# Patient Record
Sex: Female | Born: 1975 | Race: Black or African American | Hispanic: No | Marital: Single | Smoking: Current some day smoker
Health system: Southern US, Community
[De-identification: ages and names within clinical notes are randomized; demographics above are authoritative.]

## PROBLEM LIST (undated history)

## (undated) ENCOUNTER — Emergency Department (HOSPITAL_COMMUNITY): Payer: Medicaid Other

## (undated) DIAGNOSIS — E079 Disorder of thyroid, unspecified: Secondary | ICD-10-CM

## (undated) DIAGNOSIS — K439 Ventral hernia without obstruction or gangrene: Secondary | ICD-10-CM

---

## 2014-11-23 HISTORY — PX: THYROIDECTOMY: SHX17

## 2015-05-03 ENCOUNTER — Encounter (HOSPITAL_COMMUNITY): Payer: Self-pay

## 2015-05-03 ENCOUNTER — Emergency Department (HOSPITAL_COMMUNITY)
Admission: EM | Admit: 2015-05-03 | Discharge: 2015-05-03 | Disposition: A | Discharge status: Home / Self Care | Payer: Medicaid Other | Attending: Emergency Medicine | Admitting: Emergency Medicine

## 2015-05-03 DIAGNOSIS — N760 Acute vaginitis: Secondary | ICD-10-CM | POA: Insufficient documentation

## 2015-05-03 DIAGNOSIS — Z3202 Encounter for pregnancy test, result negative: Secondary | ICD-10-CM | POA: Insufficient documentation

## 2015-05-03 DIAGNOSIS — B9689 Other specified bacterial agents as the cause of diseases classified elsewhere: Secondary | ICD-10-CM

## 2015-05-03 DIAGNOSIS — Z79899 Other long term (current) drug therapy: Secondary | ICD-10-CM | POA: Insufficient documentation

## 2015-05-03 DIAGNOSIS — N898 Other specified noninflammatory disorders of vagina: Secondary | ICD-10-CM | POA: Diagnosis present

## 2015-05-03 DIAGNOSIS — L259 Unspecified contact dermatitis, unspecified cause: Secondary | ICD-10-CM | POA: Insufficient documentation

## 2015-05-03 DIAGNOSIS — E079 Disorder of thyroid, unspecified: Secondary | ICD-10-CM | POA: Diagnosis not present

## 2015-05-03 HISTORY — DX: Disorder of thyroid, unspecified: E07.9

## 2015-05-03 LAB — URINALYSIS, ROUTINE W REFLEX MICROSCOPIC
BILIRUBIN URINE: NEGATIVE
Glucose, UA: NEGATIVE mg/dL
Ketones, ur: NEGATIVE mg/dL
LEUKOCYTES UA: NEGATIVE
Nitrite: NEGATIVE
Protein, ur: NEGATIVE mg/dL
Specific Gravity, Urine: 1.016 (ref 1.005–1.030)
Urobilinogen, UA: 0.2 mg/dL (ref 0.0–1.0)
pH: 6 (ref 5.0–8.0)

## 2015-05-03 LAB — URINE MICROSCOPIC-ADD ON

## 2015-05-03 LAB — WET PREP, GENITAL
Trich, Wet Prep: NONE SEEN
Yeast Wet Prep HPF POC: NONE SEEN

## 2015-05-03 LAB — POC URINE PREG, ED: PREG TEST UR: NEGATIVE

## 2015-05-03 MED ORDER — METRONIDAZOLE 500 MG PO TABS
500.0000 mg | ORAL_TABLET | Freq: Two times a day (BID) | ORAL | Status: DC
Start: 2015-05-03 — End: 2016-11-24

## 2015-05-03 MED ORDER — PREDNISONE 20 MG PO TABS
40.0000 mg | ORAL_TABLET | Freq: Every day | ORAL | Status: DC
Start: 2015-05-03 — End: 2017-05-21

## 2015-05-03 MED ORDER — DEXAMETHASONE SODIUM PHOSPHATE 10 MG/ML IJ SOLN
10.0000 mg | Freq: Once | INTRAMUSCULAR | Status: AC
  Administered 2015-05-03: 10 mg via INTRAMUSCULAR
  Filled 2015-05-03: qty 1

## 2015-05-03 NOTE — Discharge Instructions (Signed)
Bacterial Vaginosis Bacterial vaginosis is a vaginal infection that occurs when the normal balance of bacteria in the vagina is disrupted. It results from an overgrowth of certain bacteria. This is the most common vaginal infection in women of childbearing age. Treatment is important to prevent complications, especially in pregnant women, as it can cause a premature delivery. CAUSES  Bacterial vaginosis is caused by an increase in harmful bacteria that are normally present in smaller amounts in the vagina. Several different kinds of bacteria can cause bacterial vaginosis. However, the reason that the condition develops is not fully understood. RISK FACTORS Certain activities or behaviors can put you at an increased risk of developing bacterial vaginosis, including:  Having a new sex partner or multiple sex partners.  Douching.  Using an intrauterine device (IUD) for contraception. Women do not get bacterial vaginosis from toilet seats, bedding, swimming pools, or contact with objects around them. SIGNS AND SYMPTOMS  Some women with bacterial vaginosis have no signs or symptoms. Common symptoms include:  Grey vaginal discharge.  A fishlike odor with discharge, especially after sexual intercourse.  Itching or burning of the vagina and vulva.  Burning or pain with urination. DIAGNOSIS  Your health care provider will take a medical history and examine the vagina for signs of bacterial vaginosis. A sample of vaginal fluid may be taken. Your health care provider will look at this sample under a microscope to check for bacteria and abnormal cells. A vaginal pH test may also be done.  TREATMENT  Bacterial vaginosis may be treated with antibiotic medicines. These may be given in the form of a pill or a vaginal cream. A second round of antibiotics may be prescribed if the condition comes back after treatment.  HOME CARE INSTRUCTIONS   Only take over-the-counter or prescription medicines as  directed by your health care provider.  If antibiotic medicine was prescribed, take it as directed. Make sure you finish it even if you start to feel better.  Do not have sex until treatment is completed.  Tell all sexual partners that you have a vaginal infection. They should see their health care provider and be treated if they have problems, such as a mild rash or itching.  Practice safe sex by using condoms and only having one sex partner. SEEK MEDICAL CARE IF:   Your symptoms are not improving after 3 days of treatment.  You have increased discharge or pain.  You have a fever. MAKE SURE YOU:   Understand these instructions.  Will watch your condition.  Will get help right away if you are not doing well or get worse. FOR MORE INFORMATION  Centers for Disease Control and Prevention, Division of STD Prevention: SolutionApps.co.za American Sexual Health Association (ASHA): www.ashastd.org  Document Released: 09/10/2005 Document Revised: 07/01/2013 Document Reviewed: 04/22/2013 [facility] Patient Information 2015 [place], [place]. This information is not intended to replace advice given to you by your health care provider. Make sure you discuss any questions you have with your health care provider.  Contact Dermatitis Contact dermatitis is a reaction to certain substances that touch the skin. Contact dermatitis can be either irritant contact dermatitis or allergic contact dermatitis. Irritant contact dermatitis does not require previous exposure to the substance for a reaction to occur.Allergic contact dermatitis only occurs if you have been exposed to the substance before. Upon a repeat exposure, your body reacts to the substance.  CAUSES  Many substances can cause contact dermatitis. Irritant dermatitis is most commonly caused by repeated exposure to  mildly irritating substances, such as:  Makeup.  Soaps.  Detergents.  Bleaches.  Acids.  Metal salts, such as nickel. Allergic  contact dermatitis is most commonly caused by exposure to:  Poisonous plants.  Chemicals (deodorants, shampoos).  Jewelry.  Latex.  Neomycin in triple antibiotic cream.  Preservatives in products, including clothing. SYMPTOMS  The area of skin that is exposed may develop:  Dryness or flaking.  Redness.  Cracks.  Itching.  Pain or a burning sensation.  Blisters. With allergic contact dermatitis, there may also be swelling in areas such as the eyelids, mouth, or genitals.  DIAGNOSIS  Your caregiver can usually tell what the problem is by doing a physical exam. In cases where the cause is uncertain and an allergic contact dermatitis is suspected, a patch skin test may be performed to help determine the cause of your dermatitis. TREATMENT Treatment includes protecting the skin from further contact with the irritating substance by avoiding that substance if possible. Barrier creams, powders, and gloves may be helpful. Your caregiver may also recommend:  Steroid creams or ointments applied 2 times daily. For best results, soak the rash area in cool water for 20 minutes. Then apply the medicine. Cover the area with a plastic wrap. You can store the steroid cream in the refrigerator for a "chilly" effect on your rash. That may decrease itching. Oral steroid medicines may be needed in more severe cases.  Antibiotics or antibacterial ointments if a skin infection is present.  Antihistamine lotion or an antihistamine taken by mouth to ease itching.  Lubricants to keep moisture in your skin.  Burow's solution to reduce redness and soreness or to dry a weeping rash. Mix one packet or tablet of solution in 2 cups cool water. Dip a clean washcloth in the mixture, wring it out a bit, and put it on the affected area. Leave the cloth in place for 30 minutes. Do this as often as possible throughout the day.  Taking several cornstarch or baking soda baths daily if the area is too large to  cover with a washcloth. Harsh chemicals, such as alkalis or acids, can cause skin damage that is like a burn. You should flush your skin for 15 to 20 minutes with cold water after such an exposure. You should also seek immediate medical care after exposure. Bandages (dressings), antibiotics, and pain medicine may be needed for severely irritated skin.  HOME CARE INSTRUCTIONS  Avoid the substance that caused your reaction.  Keep the area of skin that is affected away from hot water, soap, sunlight, chemicals, acidic substances, or anything else that would irritate your skin.  Do not scratch the rash. Scratching may cause the rash to become infected.  You may take cool baths to help stop the itching.  Only take over-the-counter or prescription medicines as directed by your caregiver.  See your caregiver for follow-up care as directed to make sure your skin is healing properly. SEEK MEDICAL CARE IF:   Your condition is not better after 3 days of treatment.  You seem to be getting worse.  You see signs of infection such as swelling, tenderness, redness, soreness, or warmth in the affected area.  You have any problems related to your medicines. Document Released: 09/07/2000 Document Revised: 12/03/2011 Document Reviewed: 02/13/2011 [facility] Patient Information 2015 [place], [place]. This information is not intended to replace advice given to you by your health care provider. Make sure you discuss any questions you have with your health care provider.

## 2015-05-03 NOTE — ED Notes (Signed)
No apparent distress at this time

## 2015-05-03 NOTE — ED Provider Notes (Signed)
CSN: [id number]     Arrival date & time 05/03/15  1028 History   First MD Initiated Contact with Patient 05/03/15 1127     Chief Complaint  Patient presents with  . Facial Swelling  . Vaginal Discharge     (Consider location/radiation/quality/duration/timing/severity/associated sxs/prior Treatment) HPI This is a 39 year old female who presents to the emergency department with chief complaint of facial swelling and vaginal discharge. The patient states that yesterday she used a moisturizing facemask immediately began having itching and swelling. This morning woke up with lip swelling. She denies any oral swelling, change in voice, wheezing, or sublingual swelling. Difficulty swallowing. Patient also has noted that clear vaginal discharge with some mild foul odor. She denies dyspareunia, urinary symptoms. She is sexually active with a single female partner.  Past Medical History  Diagnosis Date  . Thyroid disease    Past Surgical History  Procedure Laterality Date  . Thyroidectomy  March 2016   History reviewed. No pertinent family history. History  Substance Use Topics  . Smoking status: Never Smoker   . Smokeless tobacco: Not on file  . Alcohol Use: 2.4 oz/week    4 Glasses of wine per week   OB History    No data available     Review of Systems  Ten systems reviewed and are negative for acute change, except as noted in the HPI.    Allergies  Review of patient's allergies indicates no known allergies.  Home Medications   Prior to Admission medications   Medication Sig Start Date End Date Taking? Authorizing Provider  levothyroxine (SYNTHROID, LEVOTHROID) 100 MCG tablet Take 100 mcg by mouth daily before breakfast.   Yes Historical Provider, MD   BP 121/72 mmHg  Pulse 58  Temp(Src) 98.5 F (36.9 C) (Oral)  Resp 20  SpO2 98%  LMP 04/21/2015 Physical Exam  Constitutional: She is oriented to person, place, and time. She appears well-developed and well-nourished. No  distress.  HENT:  Head: Normocephalic and atraumatic.  Mild swelling and fine papular rash over the face with lip swelling.  Pharynx is patent, no swelling. Normal phonation.  Eyes: Conjunctivae are normal. No scleral icterus.  Neck: Normal range of motion.  Cardiovascular: Normal rate, regular rhythm and normal heart sounds.  Exam reveals no gallop and no friction rub.   No murmur heard. Pulmonary/Chest: Effort normal. No respiratory distress. She has no wheezes. She exhibits no tenderness.  Abdominal: Soft. Bowel sounds are normal. She exhibits no distension and no mass. There is no tenderness. There is no guarding.  Genitourinary:  Pelvic exam: normal external genitalia, vulva, vagina, cervix, uterus and adnexa.   Neurological: She is alert and oriented to person, place, and time.  Skin: Skin is warm and dry. She is not diaphoretic.  Nursing note and vitals reviewed.   ED Course  Procedures (including critical care time) Labs Review Labs Reviewed  URINALYSIS, ROUTINE W REFLEX MICROSCOPIC (NOT AT [facility]) - Abnormal; Notable for the following:    Hgb urine dipstick SMALL (*)    All other components within normal limits  WET PREP, GENITAL  URINE MICROSCOPIC-ADD ON  HIV ANTIBODY (ROUTINE TESTING)  RPR  POC URINE PREG, ED  GC/CHLAMYDIA PROBE AMP ([place]) NOT AT [facility]    Imaging Review No results found.   EKG Interpretation None      MDM   Final diagnoses:  BV (bacterial vaginosis)  Contact dermatitis     Patient with benign pelvic examination, positive for clue cells, no  cervical motion tenderness, no adnexal tenderness or fullness. Patient will be treated with Flagyl at discharge. Patient given IM Decadron for contact dermatitis of the face and lips. No signs of airway compromise or systemic allergic reaction. The patient will be discharged with 5 days of oral prednisone. She appears safe for discharge at this time. Her GC chlamydia tests are pending.    Arthor Captain, PA-C 05/03/15 1550  Tilden Fossa, MD 05/04/15 [phone]

## 2015-05-03 NOTE — ED Notes (Signed)
Pt reports onset yesterday morning she applied a moisturizing spa treatment mast with shea butter and coconut oil on face.  Within 6 hours face started swelling, itching and few bumps on face. Lips started swelling this morning.  No throat swelling or difficulty swallowing.  Pt also c/o onset 1 week clear vaginal discharge with slight odor.  No abd pain, vaginal itching or urinary complaints.

## 2015-05-04 LAB — GC/CHLAMYDIA PROBE AMP (~~LOC~~) NOT AT ARMC
Chlamydia: NEGATIVE
NEISSERIA GONORRHEA: NEGATIVE

## 2015-05-04 LAB — HIV ANTIBODY (ROUTINE TESTING W REFLEX): HIV SCREEN 4TH GENERATION: NONREACTIVE

## 2015-05-04 LAB — RPR: RPR Ser Ql: NONREACTIVE

## 2016-06-01 ENCOUNTER — Encounter (HOSPITAL_COMMUNITY): Payer: Self-pay | Admitting: *Deleted

## 2016-06-01 ENCOUNTER — Emergency Department (HOSPITAL_COMMUNITY): Payer: Self-pay

## 2016-06-01 ENCOUNTER — Emergency Department (HOSPITAL_COMMUNITY)
Admission: EM | Admit: 2016-06-01 | Discharge: 2016-06-02 | Disposition: A | Discharge status: Home / Self Care | Payer: Self-pay | Attending: Physician Assistant | Admitting: Physician Assistant

## 2016-06-01 DIAGNOSIS — E039 Hypothyroidism, unspecified: Secondary | ICD-10-CM

## 2016-06-01 DIAGNOSIS — R109 Unspecified abdominal pain: Secondary | ICD-10-CM | POA: Insufficient documentation

## 2016-06-01 LAB — CBC
HCT: 38.7 % (ref 36.0–46.0)
Hemoglobin: 12.8 g/dL (ref 12.0–15.0)
MCH: 30 pg (ref 26.0–34.0)
MCHC: 33.1 g/dL (ref 30.0–36.0)
MCV: 90.8 fL (ref 78.0–100.0)
Platelets: 300 10*3/uL (ref 150–400)
RBC: 4.26 MIL/uL (ref 3.87–5.11)
RDW: 16.4 % — ABNORMAL HIGH (ref 11.5–15.5)
WBC: 7.6 10*3/uL (ref 4.0–10.5)

## 2016-06-01 LAB — COMPREHENSIVE METABOLIC PANEL
ALBUMIN: 4.6 g/dL (ref 3.5–5.0)
ALK PHOS: 39 U/L (ref 38–126)
ALT: 40 U/L (ref 14–54)
AST: 45 U/L — ABNORMAL HIGH (ref 15–41)
Anion gap: 6 (ref 5–15)
BUN: 6 mg/dL (ref 6–20)
CO2: 28 mmol/L (ref 22–32)
Calcium: 9.9 mg/dL (ref 8.9–10.3)
Chloride: 103 mmol/L (ref 101–111)
Creatinine, Ser: 1.2 mg/dL — ABNORMAL HIGH (ref 0.44–1.00)
GFR calc Af Amer: >60 mL/min (ref >60)
GFR calc non Af Amer: 56 mL/min — ABNORMAL LOW (ref >60)
GLUCOSE: 87 mg/dL (ref 65–99)
POTASSIUM: 4 mmol/L (ref 3.5–5.1)
SODIUM: 137 mmol/L (ref 135–145)
Total Bilirubin: 0.9 mg/dL (ref 0.3–1.2)
Total Protein: 7.2 g/dL (ref 6.5–8.1)

## 2016-06-01 LAB — URINE MICROSCOPIC-ADD ON

## 2016-06-01 LAB — URINALYSIS, ROUTINE W REFLEX MICROSCOPIC
Bilirubin Urine: NEGATIVE
Glucose, UA: NEGATIVE mg/dL
Ketones, ur: NEGATIVE mg/dL
LEUKOCYTES UA: NEGATIVE
Nitrite: NEGATIVE
PROTEIN: NEGATIVE mg/dL
Specific Gravity, Urine: 1.017 (ref 1.005–1.030)
pH: 7.5 (ref 5.0–8.0)

## 2016-06-01 LAB — LIPASE, BLOOD: Lipase: 130 U/L — ABNORMAL HIGH (ref 11–51)

## 2016-06-01 LAB — POC URINE PREG, ED: PREG TEST UR: NEGATIVE

## 2016-06-01 MED ORDER — LEVOTHYROXINE SODIUM 100 MCG PO TABS: 100.0000 ug | ORAL_TABLET | Freq: Every day | ORAL | 0 refills | Status: DC

## 2016-06-01 MED ORDER — SODIUM CHLORIDE 0.9 % IV BOLUS (SEPSIS)
1000.0000 mL | Freq: Once | INTRAVENOUS | Status: AC
  Administered 2016-06-01: 1000 mL via INTRAVENOUS

## 2016-06-01 MED ORDER — SODIUM CHLORIDE 0.9 % IV BOLUS (SEPSIS): 1000.0000 mL | Freq: Once | INTRAVENOUS | Status: DC

## 2016-06-01 NOTE — ED Notes (Signed)
EDP at bedside  

## 2016-06-01 NOTE — Care Management (Signed)
CM met with patient to discuss f/u care. Patient is uninsured without a PCP. Discussed the [facility] patient is agreeable with establishing care at the [facility]. Appt schedule for 9/19 at 4p

## 2016-06-01 NOTE — ED Triage Notes (Signed)
The pt is c/o bi-lateral flank pain for 2 weeks.  No urinary symptoms  Just retaining fluid.  She is supposed to take thyroid med but has not taken any for 3 months  lmp 2 days ago

## 2016-06-01 NOTE — ED Provider Notes (Signed)
[facility]-EMERGENCY DEPT Provider Note   CSN: [id number] Arrival date & time: 06/01/16  1608     History   Chief Complaint Chief Complaint  Patient presents with  . Back Pain    HPI Judene CompanionDarshana Jafri is a 40 y.o. female.  The history is provided by the patient.  Back Pain   This is a recurrent problem. The current episode started more than 1 week ago. Episode frequency: inconsistently. The problem has not changed since onset.The pain is associated with no known injury. The pain is present in the lumbar spine. The quality of the pain is described as aching. The pain does not radiate. The pain is at a severity of 3/10. The pain is mild. The pain is the same all the time. Pertinent negatives include no chest pain, no fever, no abdominal pain, no abdominal swelling, no bladder incontinence, no dysuria, no pelvic pain, no paresthesias, no tingling and no weakness. She has tried nothing for the symptoms. The treatment provided no relief.    Past Medical History:  Diagnosis Date  . Thyroid disease     There are no active problems to display for this patient.   Past Surgical History:  Procedure Laterality Date  . THYROIDECTOMY  March 2016    OB History    No data available       Home Medications    Prior to Admission medications   Medication Sig Start Date End Date Taking? Authorizing Provider  levothyroxine (SYNTHROID, LEVOTHROID) 100 MCG tablet Take 100 mcg by mouth daily before breakfast.    Historical Provider, MD  metroNIDAZOLE (FLAGYL) 500 MG tablet Take 1 tablet (500 mg total) by mouth 2 (two) times daily. One po bid x 7 days 05/03/15   Arthor CaptainAbigail Harris, PA-C  predniSONE (DELTASONE) 20 MG tablet Take 2 tablets (40 mg total) by mouth daily. 05/03/15   Arthor CaptainAbigail Harris, PA-C    Family History No family history on file.  Social History Social History  Substance Use Topics  . Smoking status: Never Smoker  . Smokeless tobacco: Never Used  . Alcohol use 2.4 oz/week    4 Glasses  of wine per week     Allergies   Review of patient's allergies indicates no known allergies.   Review of Systems Review of Systems  Constitutional: Negative for fatigue and fever.  Respiratory: Negative for chest tightness.   Cardiovascular: Negative for chest pain.  Gastrointestinal: Negative for abdominal distention, abdominal pain, diarrhea, nausea and vomiting.  Genitourinary: Positive for flank pain. Negative for bladder incontinence, difficulty urinating, dyspareunia, dysuria, pelvic pain and urgency.  Musculoskeletal: Positive for back pain.  Neurological: Negative for tingling, weakness and paresthesias.  All other systems reviewed and are negative.    Physical Exam Updated Vital Signs BP 117/84   Pulse 67   Temp 98.3 F (36.8 C) (Oral)   Resp 16   Ht 5\' 1"  (1.549 m)   Wt 178 lb 9 oz (81 kg)   LMP 05/30/2016   SpO2 100%   BMI 33.74 kg/m   Physical Exam  Constitutional: She is oriented to person, place, and time. She appears well-developed and well-nourished.  HENT:  Head: Normocephalic and atraumatic.  Eyes: Right eye exhibits no discharge.  Cardiovascular: Normal rate and regular rhythm.   Pulmonary/Chest: Effort normal.  Abdominal: Soft. She exhibits no distension. There is no tenderness. There is no guarding.  Musculoskeletal:  No cva tenderness  Neurological: She is oriented to person, place, and time.  Skin: Skin is  warm and dry. She is not diaphoretic.  Psychiatric: She has a normal mood and affect.  Nursing note and vitals reviewed.    ED Treatments / Results  Labs (all labs ordered are listed, but only abnormal results are displayed) Labs Reviewed  LIPASE, BLOOD - Abnormal; Notable for the following:       Result Value   Lipase 130 (*)    All other components within normal limits  COMPREHENSIVE METABOLIC PANEL - Abnormal; Notable for the following:    Creatinine, Ser 1.20 (*)    AST 45 (*)    GFR calc non Af Amer 56 (*)    All other  components within normal limits  CBC - Abnormal; Notable for the following:    RDW 16.4 (*)    All other components within normal limits  URINALYSIS, ROUTINE W REFLEX MICROSCOPIC (NOT AT [facility]) - Abnormal; Notable for the following:    Hgb urine dipstick MODERATE (*)    All other components within normal limits  URINE MICROSCOPIC-ADD ON - Abnormal; Notable for the following:    Squamous Epithelial / LPF 0-5 (*)    Bacteria, UA RARE (*)    All other components within normal limits  POC URINE PREG, ED    EKG  EKG Interpretation None       Radiology No results found.  Procedures Procedures (including critical care time)  Medications Ordered in ED Medications - No data to display   Initial Impression / Assessment and Plan / ED Course  I have reviewed the triage vital signs and the nursing notes.  Pertinent labs & imaging results that were available during my care of the patient were reviewed by me and considered in my medical decision making (see chart for details).  Clinical Course   Patient is a very pleasant 40 year old female presenting with several symptoms. Her main complaint today is that she moved recently from [place] but unable to get her levothyroxine paid for by Medicaid. She's had trouble transitioning her Medicaid here. She also reports bilateral back pain which she is worried that her kidneys are hurting. Will do CT stone study, though her pain is mild.  \ Consulted case management- they set her up with outpatient follow up and we will provide one months script for her levothyroxine.   Patient has elevated lipase, asymptomatic - no n/v or RUQ pain. Normal LFTs. Will have her PCP (next week) repeat, consider outpatient triglycerides etc.   Patient has normal vitals and PE.   Patient is comfortable, ambulatory, and taking PO at time of discharge.  Patient expressed understanding about return precautions.    Final Clinical Impressions(s) / ED Diagnoses    Final diagnoses:  Flank pain    New Prescriptions New Prescriptions   No medications on file     Chesney Suares Randall An, MD 06/01/16 2311

## 2016-06-01 NOTE — Discharge Instructions (Signed)
Your labs showed a mildly increased creatinine which could be from mild dehydration. Please have this lab repeated this week when you see your primary care physician. In addition you had a mildly increased lipase. Please follow-up with this with her primary care physician or your GI physician. We provided you with a month long prescription for your thyroid medication.

## 2016-06-12 ENCOUNTER — Inpatient Hospital Stay: Payer: Medicaid Other | Admitting: Family Medicine

## 2016-11-24 ENCOUNTER — Encounter (HOSPITAL_COMMUNITY): Payer: Self-pay | Admitting: Emergency Medicine

## 2016-11-24 ENCOUNTER — Emergency Department (HOSPITAL_COMMUNITY)
Admission: EM | Admit: 2016-11-24 | Discharge: 2016-11-24 | Disposition: A | Discharge status: Home / Self Care | Payer: Medicaid Other | Attending: Emergency Medicine | Admitting: Emergency Medicine

## 2016-11-24 DIAGNOSIS — Z202 Contact with and (suspected) exposure to infections with a predominantly sexual mode of transmission: Secondary | ICD-10-CM | POA: Insufficient documentation

## 2016-11-24 DIAGNOSIS — N898 Other specified noninflammatory disorders of vagina: Secondary | ICD-10-CM

## 2016-11-24 DIAGNOSIS — Z711 Person with feared health complaint in whom no diagnosis is made: Secondary | ICD-10-CM

## 2016-11-24 DIAGNOSIS — N760 Acute vaginitis: Secondary | ICD-10-CM | POA: Insufficient documentation

## 2016-11-24 DIAGNOSIS — Z79899 Other long term (current) drug therapy: Secondary | ICD-10-CM | POA: Insufficient documentation

## 2016-11-24 DIAGNOSIS — B9689 Other specified bacterial agents as the cause of diseases classified elsewhere: Secondary | ICD-10-CM

## 2016-11-24 DIAGNOSIS — R300 Dysuria: Secondary | ICD-10-CM

## 2016-11-24 LAB — COMPREHENSIVE METABOLIC PANEL
ALBUMIN: 4.1 g/dL (ref 3.5–5.0)
ALK PHOS: 36 U/L — AB (ref 38–126)
ALT: 15 U/L (ref 14–54)
ANION GAP: 6 (ref 5–15)
AST: 22 U/L (ref 15–41)
BUN: 5 mg/dL — AB (ref 6–20)
CO2: 27 mmol/L (ref 22–32)
Calcium: 9.2 mg/dL (ref 8.9–10.3)
Chloride: 101 mmol/L (ref 101–111)
Creatinine, Ser: 0.84 mg/dL (ref 0.44–1.00)
GFR calc Af Amer: >60 mL/min (ref >60)
GFR calc non Af Amer: >60 mL/min (ref >60)
GLUCOSE: 82 mg/dL (ref 65–99)
Potassium: 4.1 mmol/L (ref 3.5–5.1)
SODIUM: 134 mmol/L — AB (ref 135–145)
Total Bilirubin: 1.1 mg/dL (ref 0.3–1.2)
Total Protein: 6.8 g/dL (ref 6.5–8.1)

## 2016-11-24 LAB — CBC WITH DIFFERENTIAL/PLATELET
BASOS ABS: 0.1 10*3/uL (ref 0.0–0.1)
BASOS PCT: 1 %
EOS ABS: 0.3 10*3/uL (ref 0.0–0.7)
Eosinophils Relative: 6 %
HEMATOCRIT: 39 % (ref 36.0–46.0)
HEMOGLOBIN: 13.1 g/dL (ref 12.0–15.0)
Lymphocytes Relative: 46 %
Lymphs Abs: 2.3 10*3/uL (ref 0.7–4.0)
MCH: 29.8 pg (ref 26.0–34.0)
MCHC: 33.6 g/dL (ref 30.0–36.0)
MCV: 88.6 fL (ref 78.0–100.0)
MONOS PCT: 6 %
Monocytes Absolute: 0.3 10*3/uL (ref 0.1–1.0)
NEUTROS ABS: 2.1 10*3/uL (ref 1.7–7.7)
NEUTROS PCT: 41 %
Platelets: 308 10*3/uL (ref 150–400)
RBC: 4.4 MIL/uL (ref 3.87–5.11)
RDW: 15.4 % (ref 11.5–15.5)
WBC: 5 10*3/uL (ref 4.0–10.5)

## 2016-11-24 LAB — URINALYSIS, ROUTINE W REFLEX MICROSCOPIC
Bilirubin Urine: NEGATIVE
GLUCOSE, UA: NEGATIVE mg/dL
HGB URINE DIPSTICK: NEGATIVE
Ketones, ur: NEGATIVE mg/dL
LEUKOCYTES UA: NEGATIVE
NITRITE: NEGATIVE
PH: 5 (ref 5.0–8.0)
Protein, ur: NEGATIVE mg/dL
SPECIFIC GRAVITY, URINE: 1.02 (ref 1.005–1.030)

## 2016-11-24 LAB — WET PREP, GENITAL
SPERM: NONE SEEN
Trich, Wet Prep: NONE SEEN
Yeast Wet Prep HPF POC: NONE SEEN

## 2016-11-24 LAB — HCG, QUANTITATIVE, PREGNANCY: hCG, Beta Chain, Quant, S: <1 m[IU]/mL

## 2016-11-24 LAB — RAPID HIV SCREEN (HIV 1/2 AB+AG)
HIV 1/2 Antibodies: NONREACTIVE
HIV-1 P24 Antigen - HIV24: NONREACTIVE

## 2016-11-24 MED ORDER — METRONIDAZOLE 500 MG PO TABS: 500.0000 mg | ORAL_TABLET | Freq: Two times a day (BID) | ORAL | 0 refills | Status: DC

## 2016-11-24 MED ORDER — AZITHROMYCIN 250 MG PO TABS
1000.0000 mg | ORAL_TABLET | Freq: Once | ORAL | Status: AC
  Administered 2016-11-24: 1000 mg via ORAL
  Filled 2016-11-24: qty 4

## 2016-11-24 MED ORDER — LIDOCAINE HCL (PF) 1 % IJ SOLN
0.9000 mL | Freq: Once | INTRAMUSCULAR | Status: AC
  Administered 2016-11-24: 0.9 mL
  Filled 2016-11-24: qty 5

## 2016-11-24 MED ORDER — METRONIDAZOLE 500 MG PO TABS
2000.0000 mg | ORAL_TABLET | Freq: Once | ORAL | Status: AC
  Administered 2016-11-24: 2000 mg via ORAL
  Filled 2016-11-24: qty 4

## 2016-11-24 MED ORDER — CEFTRIAXONE SODIUM 250 MG IJ SOLR
250.0000 mg | Freq: Once | INTRAMUSCULAR | Status: AC
  Administered 2016-11-24: 250 mg via INTRAMUSCULAR
  Filled 2016-11-24: qty 250

## 2016-11-24 NOTE — ED Notes (Signed)
Pelvic cart setup at bedside by Tiffany, EDT.

## 2016-11-24 NOTE — Discharge Instructions (Signed)
Part of your lab testing has resulted. You have bacterial vaginosis. This is not a sexually transmitted disease, this can happen due to changes to the acidity of the vagina. Please take medication as prescribed and until finished. Please avoid consumption of alcohol as this will happen interaction with the medicine and call severe abdominal pain and vomiting.  Your gonorrhea, chlamydia, Trichomonas, HIV and syphilis testing is not yet back. We will contact you if the results are positive. Today you received treatment for gonorrhea, chlamydia and Trichomonas given that you have a recent exposure and some symptoms.

## 2016-11-24 NOTE — ED Triage Notes (Addendum)
Pt reports after having intercourse last night she began to experience pain in her pelvic area and burning with urination. Pt also reports white vaginal discharge with odor. Pt request to have full STD workup. After triage pt reports that she has not been taking her thyroid medication for 3 months.

## 2016-11-24 NOTE — ED Notes (Signed)
ED Provider at bedside. 

## 2016-11-24 NOTE — ED Provider Notes (Signed)
[facility] Provider Note   CSN: [id number] Arrival date & time: 11/24/16  7829     History   Chief Complaint Chief Complaint  Patient presents with  . Pelvic Pain    HPI Meghan Galvan is a 41 y.o. female presents to the emergency department reporting pelvic pain, dysuria, increased white vaginal discharge associated with odd odor that she noticed after sexual intercourse last night. Patient states that she urinated after sexual intercourse and noted the symptoms. Patient states that she is sexually active with one female partner who she has been for one year, she states that she stepped using condoms and any other barrier methods of contraception 6 months ago. LMP on 2/93/18. Patient is requesting full STD workup today including HIV. Patient denies fevers, nausea, vomiting, abdominal pain, vaginal sores or lesions. Patient denies previous sexually transmitted diseases. No appetite changes.  HPI  Past Medical History:  Diagnosis Date  . Thyroid disease     There are no active problems to display for this patient.   Past Surgical History:  Procedure Laterality Date  . THYROIDECTOMY  March 2016    OB History    No data available       Home Medications    Prior to Admission medications   Medication Sig Start Date End Date Taking? Authorizing Provider  levothyroxine (SYNTHROID, LEVOTHROID) 100 MCG tablet Take 1 tablet (100 mcg total) by mouth daily before breakfast. Patient not taking: Reported on 11/24/2016 06/01/16   [name], MD  metroNIDAZOLE (FLAGYL) 500 MG tablet Take 1 tablet (500 mg total) by mouth 2 (two) times daily. 11/24/16   Liberty Handy, PA-C  predniSONE (DELTASONE) 20 MG tablet Take 2 tablets (40 mg total) by mouth daily. Patient not taking: Reported on 06/01/2016 05/03/15   Arthor Captain, PA-C    Family History No family history on file.  Social History Social History  Substance Use Topics  . Smoking status: Never Smoker  .  Smokeless tobacco: Never Used  . Alcohol use 2.4 oz/week    4 Glasses of wine per week     Allergies   Patient has no known allergies.   Review of Systems Review of Systems  Constitutional: Negative for appetite change, chills and fever.  HENT: Negative for congestion.   Eyes: Negative for visual disturbance.  Respiratory: Negative for shortness of breath.   Cardiovascular: Negative for chest pain.  Gastrointestinal: Negative for abdominal pain, constipation, diarrhea, nausea and vomiting.  Genitourinary: Positive for difficulty urinating, dysuria, pelvic pain, vaginal discharge and vaginal pain. Negative for genital sores, hematuria and vaginal bleeding.  Musculoskeletal: Negative for arthralgias and back pain.  Skin: Negative for wound.  Neurological: Negative for dizziness, numbness and headaches.  Hematological: Does not bruise/bleed easily.  Psychiatric/Behavioral: Negative.      Physical Exam Updated Vital Signs BP 117/75   Pulse 61   Temp 98.5 F (36.9 C) (Oral)   Resp 16   Ht 5\' 1"  (1.549 m)   Wt 68 kg   LMP 11/15/2016   SpO2 100%   BMI 28.34 kg/m   Physical Exam  Constitutional: She is oriented to person, place, and time. She appears well-developed and well-nourished.  HENT:  Head: Normocephalic and atraumatic.  Nose: Nose normal.  Mouth/Throat: Oropharynx is clear and moist. No oropharyngeal exudate.  No oral lesions.  Eyes: Conjunctivae and EOM are normal. Pupils are equal, round, and reactive to light.  Neck: Normal range of motion. Neck supple. No JVD present.  Cardiovascular: Normal rate, regular rhythm and normal heart sounds.   No murmur heard. Pulmonary/Chest: Effort normal and breath sounds normal. No respiratory distress. She has no wheezes. She has no rales.  Abdominal: Soft. Bowel sounds are normal. She exhibits no distension and no mass. There is no tenderness. There is no rebound and no guarding. No hernia.  Genitourinary: Pelvic exam was  performed with patient prone. Vaginal discharge found.  Genitourinary Comments: +moderate white/gray vaginal discharge +whiff test  External genitalia normal without erythema, edema, tenderness, discharge or lesions.  No groin lymphadenopathy.  Vaginal mucosa and cervix normal, pink without lesions.  Uterus in midline, smooth, not enlarged or tender. No CMT. Non palpable adnexa.  Musculoskeletal: Normal range of motion. She exhibits no deformity.  Lymphadenopathy:    She has no cervical adenopathy.  Neurological: She is alert and oriented to person, place, and time. No sensory deficit.  Skin: Skin is warm and dry. Capillary refill takes less than 2 seconds.  Psychiatric: She has a normal mood and affect. Her behavior is normal. Judgment and thought content normal.  Nursing note and vitals reviewed.    ED Treatments / Results  Labs (all labs ordered are listed, but only abnormal results are displayed) Labs Reviewed  WET PREP, GENITAL - Abnormal; Notable for the following:       Result Value   Clue Cells Wet Prep HPF POC PRESENT (*)    WBC, Wet Prep HPF POC MANY (*)    All other components within normal limits  COMPREHENSIVE METABOLIC PANEL - Abnormal; Notable for the following:    Sodium 134 (*)    BUN 5 (*)    Alkaline Phosphatase 36 (*)    All other components within normal limits  HCG, QUANTITATIVE, PREGNANCY  URINALYSIS, ROUTINE W REFLEX MICROSCOPIC  CBC WITH DIFFERENTIAL/PLATELET  RAPID HIV SCREEN (HIV 1/2 AB+AG)  HIV ANTIBODY (ROUTINE TESTING)  RPR  GC/CHLAMYDIA PROBE AMP ([place]) NOT AT [facility]    EKG  EKG Interpretation None       Radiology No results found.  Procedures Procedures (including critical care time)  Medications Ordered in ED Medications  cefTRIAXone (ROCEPHIN) injection 250 mg (250 mg Intramuscular Given 11/24/16 1356)  metroNIDAZOLE (FLAGYL) tablet 2,000 mg (2,000 mg Oral Given 11/24/16 1356)  azithromycin (ZITHROMAX) tablet 1,000 mg (1,000  mg Oral Given 11/24/16 1356)  lidocaine (PF) (XYLOCAINE) 1 % injection 0.9 mL (0.9 mLs Other Given 11/24/16 1356)     Initial Impression / Assessment and Plan / ED Course  I have reviewed the triage vital signs and the nursing notes.  Pertinent labs & imaging results that were available during my care of the patient were reviewed by me and considered in my medical decision making (see chart for details).  Clinical Course as of Nov 24 1356  Sat Nov 24, 2016  1340 Nitrite: NEGATIVE [CG]  1341 Leukocytes, UA: NEGATIVE [CG]  1341 Appearance: CLEAR [CG]  1341 WBC: 5.0 [CG]  1341 Clue Cells Wet Prep HPF POC: (!) PRESENT [CG]  1341 WBC, Wet Prep HPF POC: (!) MANY [CG]    Clinical Course User Index [CG] Liberty Handylaudia J Melessia Kaus, PA-C   41 year old female presents to ED concern for exposure to possible STD. Patient has been sexually active without barrier method of contraception with partner. Patient reports dysuria, vaginal discharge and change in odor since intercourse last night. Patient requesting full STD workup today. Exam revealed reassuring vital signs. Abdomen is soft, nontender, nondistended. There was moderate amount of  white/gray vaginal discharge on pelvic exam. And positive clue cells on wet prep. No signs of infection and urinalysis. Patient treated for gonorrhea, chlamydia, trichomoniasis in the ED. Provided STD education. Ice patient to notify partner that she has been experiencing symptoms as he may need to be tested and treated. Patient will be discharged with prescription for Flagyl. Pending Gc/chlamydia/trich, HIV, RPR results.  Final Clinical Impressions(s) / ED Diagnoses   Final diagnoses:  Bacterial vaginosis  Vaginal discharge  Dysuria  Concern about STD in female without diagnosis    New Prescriptions New Prescriptions   METRONIDAZOLE (FLAGYL) 500 MG TABLET    Take 1 tablet (500 mg total) by mouth 2 (two) times daily.     Liberty Handy, PA-C 11/24/16 1358      Shaune Pollack, MD 11/24/16 [phone]

## 2016-11-24 NOTE — ED Notes (Signed)
Pt stating she really needs to leave due to family issues.Pt made aware that provider has ordered medications for her but that she must wait 30 mins after medication administration to watch for any reaction, per protocol. Pt stated she was okay with this and could wait 30 mins after medications were administered to leave.

## 2016-11-25 LAB — HIV ANTIBODY (ROUTINE TESTING W REFLEX): HIV Screen 4th Generation wRfx: NONREACTIVE

## 2016-11-25 LAB — RPR: RPR Ser Ql: NONREACTIVE

## 2016-11-26 LAB — GC/CHLAMYDIA PROBE AMP (~~LOC~~) NOT AT ARMC
CHLAMYDIA, DNA PROBE: NEGATIVE
NEISSERIA GONORRHEA: NEGATIVE

## 2017-01-24 ENCOUNTER — Encounter (HOSPITAL_COMMUNITY): Payer: Self-pay

## 2017-01-24 ENCOUNTER — Emergency Department (HOSPITAL_COMMUNITY)
Admission: EM | Admit: 2017-01-24 | Discharge: 2017-01-24 | Disposition: A | Discharge status: Home / Self Care | Payer: Medicaid Other | Attending: Emergency Medicine | Admitting: Emergency Medicine

## 2017-01-24 DIAGNOSIS — E039 Hypothyroidism, unspecified: Secondary | ICD-10-CM | POA: Insufficient documentation

## 2017-01-24 LAB — TSH: TSH: 64.505 u[IU]/mL — ABNORMAL HIGH (ref 0.350–4.500)

## 2017-01-24 MED ORDER — LEVOTHYROXINE SODIUM 100 MCG PO TABS: 100.0000 ug | ORAL_TABLET | Freq: Every day | ORAL | 1 refills | Status: DC

## 2017-01-24 NOTE — ED Provider Notes (Signed)
[facility] Provider Note   CSN: [id number] Arrival date & time: 01/24/17  1204  By signing my name below, I, Meghan Galvan, attest that this documentation has been prepared under the direction and in the presence of Vanetta Mulders, MD. Electronically Signed: Marnette Burgess Galvan, Scribe. 01/24/2017. 12:50 PM.  History   Chief Complaint Chief Complaint  Patient presents with  . Medication Refill   The history is provided by the patient and medical records. No language interpreter was used.  Medication Refill  Medications/supplies requested:  Levothyroxine Reason for request:  Medications ran out Medications taken before: no     HPI Comments:  Meghan Galvan is a 41 y.o. female with a PMhx of Thyroid Disease and PSHx of Thyroidectomy, who presents to the Emergency Department requesting refill of her Levothyroxine (synthroid). Pt reports she has run out of medication ~two months ago and has since felt fatigue, facial swelling, chills, decreased urination, and intermittent palpitations. She states these symptoms are normal for her when she has been off her medications for extended periods of time. She tried to receive an orange card for her medication refills, but notes this process has been lengthy and cumbersome since the move to [place]  from [place]. 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 Pt denies current pain, fever, congestion, rhinorrhea, sore throat, visual disturbance, cough, SOB, CP, leg swelling, abdominal pain, diarrhea, nausea, vomiting, dysuria, hematuria, back pain, neck pain, rash, HA, bruising/bleeding easily, confusion. Pt is not currently on anticoagulants.    Past Medical History:  Diagnosis Date  . Thyroid disease    There are no active problems to display for this patient.  Past Surgical History:  Procedure Laterality Date  . THYROIDECTOMY  March 2016   OB History    No data available      Home Medications    Prior to Admission medications   Medication Sig Start Date End Date Taking? Authorizing Provider  levothyroxine (SYNTHROID, LEVOTHROID) 100 MCG tablet Take 1 tablet (100 mcg total) by mouth daily before breakfast. Patient not taking: Reported on 11/24/2016 06/01/16   [name], MD  levothyroxine (SYNTHROID, LEVOTHROID) 100 MCG tablet Take 1 tablet (100 mcg total) by mouth daily before breakfast. 01/24/17   Vanetta Mulders, MD  metroNIDAZOLE (FLAGYL) 500 MG tablet Take 1 tablet (500 mg total) by mouth 2 (two) times daily. 11/24/16   Liberty Handy, PA-C  predniSONE (DELTASONE) 20 MG tablet Take 2 tablets (40 mg total) by mouth daily. Patient not taking: Reported on 06/01/2016 05/03/15   Arthor Captain, PA-C    Family History No family history on file.  Social History Social History  Substance Use Topics  . Smoking status: Never Smoker  . Smokeless tobacco: Never Used  . Alcohol use 2.4 oz/week    4 Glasses of wine per week     Allergies   Patient has no known allergies.   Review of Systems Review of Systems  Constitutional: Positive for chills and fatigue. Negative for fever.  HENT: Positive for facial swelling. Negative for congestion, rhinorrhea and sore throat.   Eyes: Negative for visual disturbance.  Respiratory: Negative for cough and shortness of breath.   Cardiovascular: Positive for palpitations. Negative for chest pain and leg swelling.  Gastrointestinal: Negative for abdominal pain, diarrhea, nausea and vomiting.  Genitourinary: Positive for decreased urine volume. Negative for dysuria and hematuria.  Musculoskeletal: Negative for back pain and neck pain.  Skin: Negative for rash.  Neurological: Negative for headaches.  Hematological: Does not bruise/bleed easily.  Psychiatric/Behavioral: Negative for confusion.     Physical Exam Updated Vital Signs BP (!) 139/94 (BP Location: Left Arm)   Pulse 84   Temp 98.3 F (36.8 C) (Oral)    Resp 17   Ht 5\' 1"  (1.549 m)   Wt 65.8 kg   LMP 01/01/2017   SpO2 100%   BMI 27.40 kg/m   Physical Exam  Constitutional: She is oriented to person, place, and time. She appears well-developed and well-nourished.  HENT:  Head: Normocephalic.  Mucus membranes moist.   Eyes: Conjunctivae are normal.  Pupils normal, sclera clear, little nystagmus with gaze to the right side.   Neck: Normal range of motion.  Thyroidectomy scar at base of her neck, no nodules or lumps near thyroid area.   Cardiovascular: Normal rate, regular rhythm and normal heart sounds.  Exam reveals no gallop and no friction rub.   No murmur heard. No BLE edema  Pulmonary/Chest: Effort normal and breath sounds normal. No respiratory distress. She has no wheezes. She has no rales. She exhibits no tenderness.  Lungs clear bilaterally.   Abdominal: Soft. Bowel sounds are normal. She exhibits no distension. There is no tenderness.  Musculoskeletal: Normal range of motion.  Neurological: She is alert and oriented to person, place, and time. No cranial nerve deficit or sensory deficit. She exhibits normal muscle tone. Coordination normal.  Skin: Skin is warm and dry.  Psychiatric: She has a normal mood and affect.  Nursing note and vitals reviewed.    ED Treatments / Results  DIAGNOSTIC STUDIES:  Oxygen Saturation is 100% on RA, normal by my interpretation.    COORDINATION OF CARE:  12:49 PM Discussed treatment plan with pt at bedside including TSH and medication refill and pt agreed to plan. Pt refused EKG in triage.   Labs (all labs ordered are listed, but only abnormal results are displayed) Labs Reviewed  TSH    EKG  EKG Interpretation None       Radiology No results found.  Procedures Procedures (including critical care time)  Medications Ordered in ED Medications - No data to display   Initial Impression / Assessment and Plan / ED Course  I have reviewed the triage vital signs and the  nursing notes.  Pertinent labs & imaging results that were available during my care of the patient were reviewed by me and considered in my medical decision making (see chart for details).    Patient ran out of her thyroid medication Synthroid about 2 months ago. Patient has had her thyroid removed secondary to go order several years ago. Patient has been noticing some facial swelling and a few palpitations which is normal when she's off her Synthroid. Patient here to have Synthroid renewed. Patient not concerned about any of the symptoms. Patient's working towards follow-up with provider locally. Trying to get Medicaid approval. We'll renew her Synthroid. Patient nontoxic no acute distress.  Patient did not want any additional lab tests. Did agree to have TSH drawn here today. Did not  EKG.   Final Clinical Impressions(s) / ED Diagnoses   Final diagnoses:  Acquired hypothyroidism    New Prescriptions New Prescriptions   LEVOTHYROXINE (SYNTHROID, LEVOTHROID) 100 MCG TABLET    Take 1 tablet (100 mcg total) by mouth daily before breakfast.    I personally performed the services described in this documentation, which was scribed in my presence. The recorded information has been reviewed and is  accurate.       Vanetta MuldersScott Masiel Gentzler, MD 01/24/17 [phone]

## 2017-01-24 NOTE — ED Notes (Signed)
Pt refused ekg

## 2017-01-24 NOTE — Discharge Instructions (Signed)
Synthroid has been renewed. Also one month renewal provided. Follow-up with provider as soon as possible. [facility] the [facility] you follow-up with your thyroid problems. Return for any new or worse symptoms. TSH was sent off today.

## 2017-01-24 NOTE — ED Notes (Signed)
Pt refused EKG at triage.

## 2017-01-24 NOTE — ED Notes (Signed)
She is reporting "swelling in my face and arrhythmias" since she has not taken her meds

## 2017-01-24 NOTE — ED Triage Notes (Signed)
Pt is requesting a medication refill for her Levothyroxine, last time she took it was 2 months ago.

## 2017-02-27 ENCOUNTER — Emergency Department (HOSPITAL_COMMUNITY)
Admission: EM | Admit: 2017-02-27 | Discharge: 2017-02-27 | Disposition: A | Discharge status: Home / Self Care | Payer: Medicaid Other | Attending: Emergency Medicine | Admitting: Emergency Medicine

## 2017-02-27 ENCOUNTER — Encounter (HOSPITAL_COMMUNITY): Payer: Self-pay | Admitting: Emergency Medicine

## 2017-02-27 DIAGNOSIS — Z79899 Other long term (current) drug therapy: Secondary | ICD-10-CM | POA: Insufficient documentation

## 2017-02-27 DIAGNOSIS — Z760 Encounter for issue of repeat prescription: Secondary | ICD-10-CM | POA: Insufficient documentation

## 2017-02-27 DIAGNOSIS — E039 Hypothyroidism, unspecified: Secondary | ICD-10-CM | POA: Insufficient documentation

## 2017-02-27 MED ORDER — LEVOTHYROXINE SODIUM 100 MCG PO TABS: 100.0000 ug | ORAL_TABLET | Freq: Every day | ORAL | 0 refills | Status: DC

## 2017-02-27 NOTE — ED Provider Notes (Signed)
[facility] Provider Note   CSN: [id number] Arrival date & time: 02/27/17  1438     History   Chief Complaint Chief Complaint  Patient presents with  . Medication Refill    HPI Meghan Galvan is a 41 y.o. female presenting for medication refill.  Patient states that she has a history of hypothyroidism, but she ran out of her medication 3 days ago. She has been trying to get Medicaid and her orange card, but has not been successful. She reports that last month when she was on her medicine, she had improved concentration, heat intolerance, skin, and fatigue. Currently patient denies chest pain, shortness of breath, heat or cold intolerance, skin changes, palpitations, GI symptoms, or urinary symptoms. Per chart review, patient asked for medication refill last month after not taking her medicine for several months. TSH at that time was elevated, but patient did not have any Synthroid in her system at the time.   HPI  Past Medical History:  Diagnosis Date  . Thyroid disease     There are no active problems to display for this patient.   Past Surgical History:  Procedure Laterality Date  . THYROIDECTOMY  March 2016    OB History    No data available       Home Medications    Prior to Admission medications   Medication Sig Start Date End Date Taking? Authorizing Provider  levothyroxine (SYNTHROID, LEVOTHROID) 100 MCG tablet Take 1 tablet (100 mcg total) by mouth daily before breakfast. 01/24/17   Vanetta Mulders, MD  levothyroxine (SYNTHROID, LEVOTHROID) 100 MCG tablet Take 1 tablet (100 mcg total) by mouth daily before breakfast. 02/27/17   Jan Walters, PA-C  metroNIDAZOLE (FLAGYL) 500 MG tablet Take 1 tablet (500 mg total) by mouth 2 (two) times daily. 11/24/16   Liberty Handy, PA-C  predniSONE (DELTASONE) 20 MG tablet Take 2 tablets (40 mg total) by mouth daily. Patient not taking: Reported on 06/01/2016 05/03/15   Arthor Captain, PA-C    Family  History No family history on file.  Social History Social History  Substance Use Topics  . Smoking status: Never Smoker  . Smokeless tobacco: Never Used  . Alcohol use 2.4 oz/week    4 Glasses of wine per week     Allergies   Patient has no known allergies.   Review of Systems Review of Systems  Constitutional: Negative for appetite change, chills, fatigue, fever and unexpected weight change.  Respiratory: Negative for cough, chest tightness and shortness of breath.   Cardiovascular: Negative for chest pain, palpitations and leg swelling.  Gastrointestinal: Negative for abdominal pain, constipation, diarrhea, nausea and vomiting.  Endocrine: Negative for cold intolerance and heat intolerance.  Genitourinary: Negative for dysuria, flank pain and hematuria.  Musculoskeletal: Negative for arthralgias and myalgias.  Skin: Negative for pallor and rash.  Neurological: Negative for dizziness, weakness, light-headedness and headaches.  Psychiatric/Behavioral: Negative for agitation and confusion.     Physical Exam Updated Vital Signs BP 121/72 (BP Location: Right Arm)   Pulse 64   Temp 98.3 F (36.8 C) (Oral)   Resp 17   Ht 5\' 1"  (1.549 m)   Wt 65.8 kg (145 lb)   SpO2 100%   BMI 27.40 kg/m   Physical Exam  Constitutional: She is oriented to person, place, and time. She appears well-developed and well-nourished. No distress.  HENT:  Head: Normocephalic and atraumatic.  Eyes: Conjunctivae and EOM are normal. Pupils are equal, round, and reactive to light.  Neck: Normal range of motion. Neck supple. No thyromegaly (thyroid surgically absent ) present.  Cardiovascular: Normal rate, regular rhythm, normal heart sounds and intact distal pulses.   Pulmonary/Chest: Effort normal and breath sounds normal.  Abdominal: Soft. She exhibits no distension. There is no tenderness.  Musculoskeletal: Normal range of motion.  Lymphadenopathy:    She has no cervical adenopathy.   Neurological: She is alert and oriented to person, place, and time.  Skin: Skin is warm and dry. No rash noted. She is not diaphoretic. No erythema. No pallor.  Psychiatric: She has a normal mood and affect.     ED Treatments / Results  Labs (all labs ordered are listed, but only abnormal results are displayed) Labs Reviewed - No data to display  EKG  EKG Interpretation None       Radiology No results found.  Procedures Procedures (including critical care time)  Medications Ordered in ED Medications - No data to display   Initial Impression / Assessment and Plan / ED Course  I have reviewed the triage vital signs and the nursing notes.  Pertinent labs & imaging results that were available during my care of the patient were reviewed by me and considered in my medical decision making (see chart for details).     Discussed at length the importance of chronic medical management by primary care. Patient voices understanding. Clinical care coordinator contacted to help set up appointment for patient. Will refill Synthroid for this month, but patient to follow-up with her new primary care next month. Patient had improvement of symptoms at the current dose she was at last month, so I will refill at that dose. Primary care to retest TSH. Patient to return to emergency department if she changes any symptoms. Patient voices understanding, and agreement.  Final Clinical Impressions(s) / ED Diagnoses   Final diagnoses:  Acquired hypothyroidism  Medication refill    New Prescriptions Discharge Medication List as of 02/27/2017  3:49 PM       Alveria ApleyCaccavale, Peterson Mathey, PA-C 02/27/17 1606    [place], Amadeo GarnetPedro Eduardo, MD 03/02/17 0107

## 2017-02-27 NOTE — ED Triage Notes (Signed)
Pt states "i ran out of my levothyroxine, I last took it three days ago".

## 2017-02-27 NOTE — Discharge Instructions (Signed)
Take the synthroid as prescribed.  Make sure to follow-up with primary care for further medication management.

## 2017-02-27 NOTE — ED Notes (Signed)
Provider at the bedside.  

## 2017-02-27 NOTE — ED Notes (Signed)
Pt here for refill of Synthroid. Has not established PCP as of yet. Last dose was 3 days ago.

## 2017-03-21 ENCOUNTER — Encounter (INDEPENDENT_AMBULATORY_CARE_PROVIDER_SITE_OTHER): Payer: Self-pay | Admitting: Physician Assistant

## 2017-03-21 ENCOUNTER — Ambulatory Visit (INDEPENDENT_AMBULATORY_CARE_PROVIDER_SITE_OTHER): Payer: Self-pay | Admitting: Physician Assistant

## 2017-03-21 VITALS — BP 123/80 | HR 85 | Temp 98.4°F | Ht 60.5 in | Wt 165.4 lb

## 2017-03-21 DIAGNOSIS — E039 Hypothyroidism, unspecified: Secondary | ICD-10-CM

## 2017-03-21 MED ORDER — LEVOTHYROXINE SODIUM 100 MCG PO TABS: 100.0000 ug | ORAL_TABLET | Freq: Every day | ORAL | 3 refills | Status: DC

## 2017-03-21 NOTE — Progress Notes (Signed)
Subjective:  Patient ID: Meghan Galvan, female    DOB: [birth year]  Age: 41 y.o. MRN: [id number]  CC: acquired hypothyroidism   HPI Meghan Galvan is a 41 y.o. female with a PMH of acquired hypothyroidism. Had thyroidectomy on the fall of 2016. Says she is feeling better since getting back on Synthroid 100 mcg. Has heavy menstrual cycles, heat intolerance. Denies fatigue, depression, and dry skin is getting better. Has no other complaints or symptoms.     Outpatient Medications Prior to Visit  Medication Sig Dispense Refill  . levothyroxine (SYNTHROID, LEVOTHROID) 100 MCG tablet Take 1 tablet (100 mcg total) by mouth daily before breakfast. 30 tablet 1  . predniSONE (DELTASONE) 20 MG tablet Take 2 tablets (40 mg total) by mouth daily. (Patient not taking: Reported on 06/01/2016) 10 tablet 0  . levothyroxine (SYNTHROID, LEVOTHROID) 100 MCG tablet Take 1 tablet (100 mcg total) by mouth daily before breakfast. (Patient not taking: Reported on 03/21/2017) 30 tablet 0  . metroNIDAZOLE (FLAGYL) 500 MG tablet Take 1 tablet (500 mg total) by mouth 2 (two) times daily. 14 tablet 0   No facility-administered medications prior to visit.      ROS Review of Systems  Constitutional: Negative for chills, fever and malaise/fatigue.  Eyes: Negative for blurred vision.  Respiratory: Negative for shortness of breath.   Cardiovascular: Negative for chest pain and palpitations.  Gastrointestinal: Negative for abdominal pain and nausea.  Genitourinary: Negative for dysuria and hematuria.       Heavy menstrual cycle  Musculoskeletal: Negative for joint pain and myalgias.  Skin: Negative for rash.  Neurological: Negative for tingling and headaches.  Endo/Heme/Allergies:       Heat intolerance  Psychiatric/Behavioral: Negative for depression. The patient is not nervous/anxious.     Objective:  BP 123/80 (BP Location: Left Arm, Patient Position: Sitting, Cuff Size: Normal)   Pulse 85   Temp 98.4 F  (36.9 C) (Oral)   Ht 5' 0.5" (1.537 m)   Wt 165 lb 6.4 oz (75 kg)   LMP 03/15/2017 (Exact Date)   SpO2 100%   BMI 31.77 kg/m   BP/Weight 03/21/2017 02/27/2017 01/24/2017  Systolic BP 123 121 139  Diastolic BP 80 72 94  Wt. (Lbs) 165.4 145 145  BMI 31.77 27.4 27.4      Physical Exam  Constitutional: She is oriented to person, place, and time.  Well developed, well nourished, NAD, polite  HENT:  Head: Normocephalic and atraumatic.  Eyes: No scleral icterus.  Neck: Normal range of motion. Neck supple.  Cardiovascular: Normal rate, regular rhythm and normal heart sounds.   Pulmonary/Chest: Effort normal and breath sounds normal.  Musculoskeletal: She exhibits no edema.  Neurological: She is alert and oriented to person, place, and time. No cranial nerve deficit. Coordination normal.  Skin: Skin is warm and dry. No rash noted. No erythema. No pallor.  Thyroidectomy scar  Psychiatric: She has a normal mood and affect. Her behavior is normal. Thought content normal.  Vitals reviewed.    Assessment & Plan:   1. Acquired hypothyroidism - Thyroid Panel With TSH - refill levothyroxine (SYNTHROID, LEVOTHROID) 100 MCG tablet; Take 1 tablet (100 mcg total) by mouth daily before breakfast.  Dispense: 90 tablet; Refill: 3   Meds ordered this encounter  Medications  . levothyroxine (SYNTHROID, LEVOTHROID) 100 MCG tablet    Sig: Take 1 tablet (100 mcg total) by mouth daily before breakfast.    Dispense:  90 tablet    Refill:  3  Order Specific Question:   Supervising Provider    Answer:   Quentin AngstJEGEDE, OLUGBEMIGA E [[id number]]    Follow-up: Return in about 8 weeks (around 05/16/2017) for hypothyroidism.   Loletta Specteroger David Gomez PA

## 2017-03-21 NOTE — Patient Instructions (Signed)
Hypothyroidism Hypothyroidism is a disorder of the thyroid. The thyroid is a large gland that is located in the lower front of the neck. The thyroid releases hormones that control how the body works. With hypothyroidism, the thyroid does not make enough of these hormones. What are the causes? Causes of hypothyroidism may include:  Viral infections.  Pregnancy.  Your own defense system (immune system) attacking your thyroid.  Certain medicines.  Birth defects.  Past radiation treatments to your head or neck.  Past treatment with radioactive iodine.  Past surgical removal of part or all of your thyroid.  Problems with the gland that is located in the center of your brain (pituitary).  What are the signs or symptoms? Signs and symptoms of hypothyroidism may include:  Feeling as though you have no energy (lethargy).  Inability to tolerate cold.  Weight gain that is not explained by a change in diet or exercise habits.  Dry skin.  Coarse hair.  Menstrual irregularity.  Slowing of thought processes.  Constipation.  Sadness or depression.  How is this diagnosed? Your health care provider may diagnose hypothyroidism with blood tests and ultrasound tests. How is this treated? Hypothyroidism is treated with medicine that replaces the hormones that your body does not make. After you begin treatment, it may take several weeks for symptoms to go away. Follow these instructions at home:  Take medicines only as directed by your health care provider.  If you start taking any new medicines, tell your health care provider.  Keep all follow-up visits as directed by your health care provider. This is important. As your condition improves, your dosage needs may change. You will need to have blood tests regularly so that your health care provider can watch your condition. Contact a health care provider if:  Your symptoms do not get better with treatment.  You are taking thyroid  replacement medicine and: ? You sweat excessively. ? You have tremors. ? You feel anxious. ? You lose weight rapidly. ? You cannot tolerate heat. ? You have emotional swings. ? You have diarrhea. ? You feel weak. Get help right away if:  You develop chest pain.  You develop an irregular heartbeat.  You develop a rapid heartbeat. This information is not intended to replace advice given to you by your health care provider. Make sure you discuss any questions you have with your health care provider. Document Released: 09/10/2005 Document Revised: 02/16/2016 Document Reviewed: 01/26/2014 Elsevier Interactive Patient Education  2017 Elsevier Inc.  

## 2017-03-22 ENCOUNTER — Other Ambulatory Visit (INDEPENDENT_AMBULATORY_CARE_PROVIDER_SITE_OTHER): Payer: Self-pay | Admitting: Physician Assistant

## 2017-03-22 LAB — THYROID PANEL WITH TSH
Free Thyroxine Index: 1.9 (ref 1.2–4.9)
T3 UPTAKE RATIO: 28 % (ref 24–39)
T4, Total: 6.8 ug/dL (ref 4.5–12.0)
TSH: 5.76 u[IU]/mL — ABNORMAL HIGH (ref 0.450–4.500)

## 2017-03-25 ENCOUNTER — Telehealth (INDEPENDENT_AMBULATORY_CARE_PROVIDER_SITE_OTHER): Payer: Self-pay

## 2017-03-25 NOTE — Telephone Encounter (Signed)
Results given to patient and she expressed understanding. Meghan Galvan, CMA

## 2017-03-25 NOTE — Telephone Encounter (Signed)
-----   Message from Loletta Specteroger David Gomez, PA-C sent at 03/22/2017  3:12 PM EDT ----- Please notify patient that her thyroid level is near normal and to continue on 100 mcg of Levothyroxine. Thank you.

## 2017-05-21 ENCOUNTER — Encounter (INDEPENDENT_AMBULATORY_CARE_PROVIDER_SITE_OTHER): Payer: Self-pay | Admitting: Physician Assistant

## 2017-05-21 ENCOUNTER — Ambulatory Visit (INDEPENDENT_AMBULATORY_CARE_PROVIDER_SITE_OTHER): Payer: Self-pay | Admitting: Physician Assistant

## 2017-05-21 VITALS — BP 112/69 | HR 69 | Temp 98.6°F | Wt 163.4 lb

## 2017-05-21 DIAGNOSIS — Z79899 Other long term (current) drug therapy: Secondary | ICD-10-CM

## 2017-05-21 DIAGNOSIS — E039 Hypothyroidism, unspecified: Secondary | ICD-10-CM

## 2017-05-21 DIAGNOSIS — L0390 Cellulitis, unspecified: Secondary | ICD-10-CM

## 2017-05-21 MED ORDER — CEPHALEXIN 500 MG PO TABS: 500.0000 mg | ORAL_TABLET | Freq: Three times a day (TID) | ORAL | 0 refills | Status: AC

## 2017-05-21 MED ORDER — FLUCONAZOLE 150 MG PO TABS: 150.0000 mg | ORAL_TABLET | Freq: Once | ORAL | 0 refills | Status: AC

## 2017-05-21 NOTE — Progress Notes (Signed)
Subjective:  Patient ID: Meghan Galvan, female    DOB: November 29, 1975  Age: 41 y.o. MRN: [id number]  CC: F/u thyroid   HPI Meghan Galvan is a 41 y.o. female with a PMH of acquired hypothyroidism. Presents to f/u on hypothyroidism. Has been taking Synthroid 100 mcg as directed. Does not endorse any symptoms.    Also complains of boil in the right inner thigh x4 days. Has already drained but there is still a "bump". Has applied some Tea Tree Oil to help with infection. Does not endorse constitutional symptoms.      Outpatient Medications Prior to Visit  Medication Sig Dispense Refill  . levothyroxine (SYNTHROID, LEVOTHROID) 100 MCG tablet Take 1 tablet (100 mcg total) by mouth daily before breakfast. 90 tablet 3  . predniSONE (DELTASONE) 20 MG tablet Take 2 tablets (40 mg total) by mouth daily. (Patient not taking: Reported on 06/01/2016) 10 tablet 0   No facility-administered medications prior to visit.      ROS Review of Systems  Constitutional: Negative for chills, fever and malaise/fatigue.  Eyes: Negative for blurred vision.  Respiratory: Negative for shortness of breath.   Cardiovascular: Negative for chest pain and palpitations.  Gastrointestinal: Negative for abdominal pain and nausea.  Genitourinary: Negative for dysuria and hematuria.  Musculoskeletal: Negative for joint pain and myalgias.  Skin: Negative for rash.  Neurological: Negative for tingling and headaches.  Psychiatric/Behavioral: Negative for depression. The patient is not nervous/anxious.     Objective:  BP 112/69 (BP Location: Left Arm, Patient Position: Sitting, Cuff Size: Normal)   Pulse 69   Temp 98.6 F (37 C) (Oral)   Wt 163 lb 6.4 oz (74.1 kg)   LMP 04/29/2017 (Exact Date)   SpO2 100%   BMI 31.39 kg/m   BP/Weight 05/21/2017 03/21/2017 02/27/2017  Systolic BP 112 123 121  Diastolic BP 69 80 72  Wt. (Lbs) 163.4 165.4 145  BMI 31.39 31.77 27.4      Physical Exam  Constitutional: She is  oriented to person, place, and time.  Well developed, well nourished, NAD, polite  HENT:  Head: Normocephalic and atraumatic.  Cardiovascular: Normal rate, regular rhythm and normal heart sounds.   Pulmonary/Chest: Effort normal and breath sounds normal.  Musculoskeletal: She exhibits no edema.  Neurological: She is alert and oriented to person, place, and time.  Skin: Skin is warm and dry. No rash noted. No erythema. No pallor.  1.5 cm drained lesion on right inner thigh. Mild erythema, no induration.  Psychiatric: She has a normal mood and affect. Her behavior is normal. Thought content normal.  Vitals reviewed.    Assessment & Plan:    1. Acquired hypothyroidism - Thyroid Panel With TSH  2. Cellulitis, unspecified cellulitis site - Begin Cephalexin 500 MG tablet; Take 1 tablet (500 mg total) by mouth 3 (three) times daily.  Dispense: 30 tablet; Refill: 0  3. High risk medication use - Begin fluconazole (DIFLUCAN) 150 MG tablet; Take 1 tablet (150 mg total) by mouth once.  Dispense: 1 tablet; Refill: 0   Meds ordered this encounter  Medications  . Cephalexin 500 MG tablet    Sig: Take 1 tablet (500 mg total) by mouth 3 (three) times daily.    Dispense:  30 tablet    Refill:  0    Order Specific Question:   Supervising Provider    Answer:   Quentin Angst [id number]  . fluconazole (DIFLUCAN) 150 MG tablet    Sig: Take 1 tablet (150  mg total) by mouth once.    Dispense:  1 tablet    Refill:  0    Order Specific Question:   Supervising Provider    Answer:   Quentin Angst [id number]    Follow-up: Return if symptoms worsen or fail to improve.   Loletta Specter PA

## 2017-05-21 NOTE — Patient Instructions (Signed)

## 2017-05-23 LAB — THYROID PANEL WITH TSH
Free Thyroxine Index: 2.4 (ref 1.2–4.9)
T3 Uptake Ratio: 29 % (ref 24–39)
T4, Total: 8.4 ug/dL (ref 4.5–12.0)
TSH: 3.71 u[IU]/mL (ref 0.450–4.500)

## 2017-10-31 ENCOUNTER — Ambulatory Visit (INDEPENDENT_AMBULATORY_CARE_PROVIDER_SITE_OTHER): Payer: Self-pay | Admitting: Physician Assistant

## 2017-10-31 ENCOUNTER — Encounter (INDEPENDENT_AMBULATORY_CARE_PROVIDER_SITE_OTHER): Payer: Self-pay | Admitting: Physician Assistant

## 2017-10-31 VITALS — BP 120/75 | HR 63 | Temp 98.5°F | Resp 18 | Ht 61.0 in | Wt 171.0 lb

## 2017-10-31 DIAGNOSIS — K5909 Other constipation: Secondary | ICD-10-CM

## 2017-10-31 DIAGNOSIS — E039 Hypothyroidism, unspecified: Secondary | ICD-10-CM

## 2017-10-31 DIAGNOSIS — R1011 Right upper quadrant pain: Secondary | ICD-10-CM

## 2017-10-31 DIAGNOSIS — R109 Unspecified abdominal pain: Secondary | ICD-10-CM

## 2017-10-31 LAB — POCT URINALYSIS DIPSTICK
BILIRUBIN UA: NEGATIVE
GLUCOSE UA: NEGATIVE
KETONES UA: NEGATIVE
Leukocytes, UA: NEGATIVE
Nitrite, UA: NEGATIVE
Protein, UA: NEGATIVE
Spec Grav, UA: 1.025 (ref 1.010–1.025)
UROBILINOGEN UA: 0.2 U/dL
pH, UA: 7 (ref 5.0–8.0)

## 2017-10-31 MED ORDER — NAPROXEN 500 MG PO TABS: 500.0000 mg | ORAL_TABLET | Freq: Two times a day (BID) | ORAL | 0 refills | Status: DC

## 2017-10-31 MED ORDER — SENNOSIDES-DOCUSATE SODIUM 8.6-50 MG PO TABS: 1.0000 | ORAL_TABLET | Freq: Every day | ORAL | 0 refills | Status: DC

## 2017-10-31 NOTE — Patient Instructions (Addendum)
Please abstain from fatty/fried foods. Drink 8-10 cups of water per day. Be aware of the following:  Cholecystitis Cholecystitis is swelling and irritation (inflammation) of the gallbladder. The gallbladder is an organ that is shaped like a pear. It is under the liver on the right side of the body. This condition is often caused by gallstones. You doctor may do tests to see how your gallbladder works. These tests may include:  Imaging tests, such as: ? An ultrasound. ? MRI.  Tests that check how your liver works.  This condition needs treatment. Follow these instructions at home: Home care will depend on your treatment. In general:  Take over-the-counter and prescription medicines only as told by your doctor.  If you were prescribed an antibiotic medicine, take it as told by your doctor. Do not stop taking the antibiotic even if you start to feel better.  Follow instructions from your doctor about what to eat or drink. When you are allowed to eat, avoid eating or drinking anything that causes your symptoms to start.  Keep all follow-up visits as told by your doctor. This is important.  Contact a doctor if:  You have pain and your medicine does not help.  You have a fever. Get help right away if:  Your pain moves to: ? Another part of your belly (abdomen). ? Your back.  Your symptoms do not go away.  You have new symptoms. This information is not intended to replace advice given to you by your health care provider. Make sure you discuss any questions you have with your health care provider. Document Released: 08/30/2011 Document Revised: 02/16/2016 Document Reviewed: 12/22/2014 Elsevier Interactive Patient Education  2018 [employer]

## 2017-10-31 NOTE — Progress Notes (Signed)
Subjective:  Patient ID: Meghan Galvan, female    DOB: [birth year]  Age: 42 y.o. MRN: [id number]  CC: flank pain  HPI  Meghan Johnsonis a 42 y.o.femalewith a medical history of acquired hypothyroidism presents with RUQ abdominal pain and right flank painsince two days ago. Pain originally 8/10 but presently 4/10 and described as a dull pain. Does not endorse fever, chills, nausea, vomiting, chest pain, palpitations, headache, rash, or GU sxs. No diarrhea but does endorse chronic constipation. Does not hydrate adequately especially when at work. Does not endorse other symptoms or complaints.    Outpatient Medications Prior to Visit  Medication Sig Dispense Refill  . levothyroxine (SYNTHROID, LEVOTHROID) 100 MCG tablet Take 1 tablet (100 mcg total) by mouth daily before breakfast. 90 tablet 3   No facility-administered medications prior to visit.      ROS Review of Systems  Constitutional: Negative for chills, fever and malaise/fatigue.  Eyes: Negative for blurred vision.  Respiratory: Negative for shortness of breath.   Cardiovascular: Negative for chest pain and palpitations.  Gastrointestinal: Positive for abdominal pain and constipation. Negative for diarrhea, heartburn, nausea and vomiting.  Genitourinary: Negative for dysuria and hematuria.  Musculoskeletal: Negative for joint pain and myalgias.  Skin: Negative for rash.  Neurological: Negative for tingling and headaches.  Psychiatric/Behavioral: Negative for depression. The patient is not nervous/anxious.     Objective:  BP 120/75 (BP Location: Right Arm, Patient Position: Sitting, Cuff Size: Normal)   Pulse 63   Temp 98.5 F (36.9 C) (Oral)   Resp 18   Ht 5\' 1"  (1.549 m)   Wt 171 lb (77.6 kg)   LMP 10/07/2017   SpO2 99%   BMI 32.31 kg/m   BP/Weight 10/31/2017 05/21/2017 03/21/2017  Systolic BP 120 112 123  Diastolic BP 75 69 80  Wt. (Lbs) 171 163.4 165.4  BMI 32.31 31.39 31.77      Physical Exam   Constitutional: She is oriented to person, place, and time.  Well developed, well nourished, NAD, polite  HENT:  Head: Normocephalic and atraumatic.  Eyes: No scleral icterus.  Neck: Normal range of motion. Neck supple. No thyromegaly present.  Cardiovascular: Normal rate, regular rhythm and normal heart sounds.  Pulmonary/Chest: Effort normal and breath sounds normal. No respiratory distress. She has no wheezes.  Abdominal: Soft. Bowel sounds are normal. There is tenderness (mild right flank and RUQ pain).  Musculoskeletal: She exhibits no edema.  Neurological: She is alert and oriented to person, place, and time. No cranial nerve deficit. Coordination normal.  Skin: Skin is warm and dry. No rash noted. No erythema. No pallor.  Psychiatric: She has a normal mood and affect. Her behavior is normal. Thought content normal.  Vitals reviewed.    Assessment & Plan:    1. Right flank pain - Urinalysis Dipstick with Blood Trace Intact - Comprehensive metabolic panel - CBC with Differential - Begin naproxen (NAPROSYN) 500 MG tablet; Take 1 tablet (500 mg total) by mouth 2 (two) times daily with a meal.  Dispense: 30 tablet; Refill: 0  2. Right upper quadrant abdominal pain - Urinalysis Dipstick - Comprehensive metabolic panel - CBC with Differential - Begin naproxen (NAPROSYN) 500 MG tablet; Take 1 tablet (500 mg total) by mouth 2 (two) times daily with a meal.  Dispense: 30 tablet; Refill: 0  3. Acquired hypothyroidism - Thyroid Panel With TSH  4. Chronic constipation - Begin senna-docusate (SENOKOT-S) 8.6-50 MG tablet; Take 1 tablet by mouth daily.  Dispense: 30 tablet;  Refill: 0   Meds ordered this encounter  Medications  . naproxen (NAPROSYN) 500 MG tablet    Sig: Take 1 tablet (500 mg total) by mouth 2 (two) times daily with a meal.    Dispense:  30 tablet    Refill:  0    Order Specific Question:   Supervising Provider    Answer:   Quentin AngstJEGEDE, OLUGBEMIGA E [id number]]  .  senna-docusate (SENOKOT-S) 8.6-50 MG tablet    Sig: Take 1 tablet by mouth daily.    Dispense:  30 tablet    Refill:  0    Order Specific Question:   Supervising Provider    Answer:   Quentin AngstJEGEDE, OLUGBEMIGA E [id number]]    Follow-up: Return in about 4 weeks (around 11/28/2017) for PAP smear.   Loletta Specteroger David Clarisse Rodriges PA

## 2017-11-01 ENCOUNTER — Other Ambulatory Visit (INDEPENDENT_AMBULATORY_CARE_PROVIDER_SITE_OTHER): Payer: Self-pay | Admitting: Physician Assistant

## 2017-11-01 ENCOUNTER — Telehealth (INDEPENDENT_AMBULATORY_CARE_PROVIDER_SITE_OTHER): Payer: Self-pay | Admitting: *Deleted

## 2017-11-01 DIAGNOSIS — E039 Hypothyroidism, unspecified: Secondary | ICD-10-CM

## 2017-11-01 LAB — COMPREHENSIVE METABOLIC PANEL
ALBUMIN: 4.4 g/dL (ref 3.5–5.5)
ALK PHOS: 38 IU/L — AB (ref 39–117)
ALT: 12 IU/L (ref 0–32)
AST: 14 IU/L (ref 0–40)
Albumin/Globulin Ratio: 1.7 (ref 1.2–2.2)
BUN / CREAT RATIO: 13 (ref 9–23)
BUN: 9 mg/dL (ref 6–24)
Bilirubin Total: 0.6 mg/dL (ref 0.0–1.2)
CALCIUM: 9.5 mg/dL (ref 8.7–10.2)
CO2: 22 mmol/L (ref 20–29)
Chloride: 102 mmol/L (ref 96–106)
Creatinine, Ser: 0.7 mg/dL (ref 0.57–1.00)
GFR, EST AFRICAN AMERICAN: 124 mL/min/{1.73_m2} (ref >59)
GFR, EST NON AFRICAN AMERICAN: 107 mL/min/{1.73_m2} (ref >59)
GLOBULIN, TOTAL: 2.6 g/dL (ref 1.5–4.5)
Glucose: 82 mg/dL (ref 65–99)
Potassium: 4.1 mmol/L (ref 3.5–5.2)
SODIUM: 139 mmol/L (ref 134–144)
Total Protein: 7 g/dL (ref 6.0–8.5)

## 2017-11-01 LAB — THYROID PANEL WITH TSH
Free Thyroxine Index: 1.2 (ref 1.2–4.9)
T3 UPTAKE RATIO: 24 % (ref 24–39)
T4, Total: 4.9 ug/dL (ref 4.5–12.0)
TSH: 13.41 u[IU]/mL — ABNORMAL HIGH (ref 0.450–4.500)

## 2017-11-01 LAB — CBC WITH DIFFERENTIAL/PLATELET
BASOS: 0 %
Basophils Absolute: 0 10*3/uL (ref 0.0–0.2)
EOS (ABSOLUTE): 0.1 10*3/uL (ref 0.0–0.4)
Eos: 2 %
HEMATOCRIT: 38.5 % (ref 34.0–46.6)
Hemoglobin: 12.3 g/dL (ref 11.1–15.9)
Immature Grans (Abs): 0 10*3/uL (ref 0.0–0.1)
Immature Granulocytes: 0 %
LYMPHS ABS: 2.4 10*3/uL (ref 0.7–3.1)
Lymphs: 48 %
MCH: 29.2 pg (ref 26.6–33.0)
MCHC: 31.9 g/dL (ref 31.5–35.7)
MCV: 91 fL (ref 79–97)
MONOS ABS: 0.2 10*3/uL (ref 0.1–0.9)
Monocytes: 5 %
Neutrophils Absolute: 2.2 10*3/uL (ref 1.4–7.0)
Neutrophils: 45 %
Platelets: 302 10*3/uL (ref 150–379)
RBC: 4.21 x10E6/uL (ref 3.77–5.28)
RDW: 14.7 % (ref 12.3–15.4)
WBC: 5 10*3/uL (ref 3.4–10.8)

## 2017-11-01 MED ORDER — LEVOTHYROXINE SODIUM 112 MCG PO TABS: 112.0000 ug | ORAL_TABLET | Freq: Every day | ORAL | 3 refills | Status: DC

## 2017-11-01 NOTE — Telephone Encounter (Signed)
Patient verified DOB Patient is aware of needing to start the 112 mcg of levothyroxine to address TSH current level. Patient expressed her understanding and would pick up the new medication from [facility].

## 2017-11-01 NOTE — Telephone Encounter (Signed)
-----   Message from Loletta Specteroger David Gomez, PA-C sent at 11/01/2017 12:06 PM EST ----- Will need a mildly stronger dose of synthroid. Needs 112 mcg instead of 100 mcg. [facility]

## 2017-11-28 ENCOUNTER — Other Ambulatory Visit: Payer: Self-pay

## 2017-11-28 ENCOUNTER — Other Ambulatory Visit (HOSPITAL_COMMUNITY)
Admission: RE | Admit: 2017-11-28 | Discharge: 2017-11-28 | Disposition: A | Discharge status: Home / Self Care | Payer: Self-pay | Source: Ambulatory Visit | Attending: Physician Assistant | Admitting: Physician Assistant

## 2017-11-28 ENCOUNTER — Encounter (INDEPENDENT_AMBULATORY_CARE_PROVIDER_SITE_OTHER): Payer: Self-pay | Admitting: Physician Assistant

## 2017-11-28 ENCOUNTER — Ambulatory Visit (INDEPENDENT_AMBULATORY_CARE_PROVIDER_SITE_OTHER): Payer: Self-pay | Admitting: Physician Assistant

## 2017-11-28 VITALS — BP 126/84 | HR 68 | Temp 98.1°F | Wt 176.8 lb

## 2017-11-28 DIAGNOSIS — B373 Candidiasis of vulva and vagina: Secondary | ICD-10-CM

## 2017-11-28 DIAGNOSIS — Z124 Encounter for screening for malignant neoplasm of cervix: Secondary | ICD-10-CM | POA: Insufficient documentation

## 2017-11-28 DIAGNOSIS — B3731 Acute candidiasis of vulva and vagina: Secondary | ICD-10-CM

## 2017-11-28 MED ORDER — FLUCONAZOLE 150 MG PO TABS: 150.0000 mg | ORAL_TABLET | ORAL | 3 refills | Status: DC

## 2017-11-28 NOTE — Progress Notes (Signed)
Subjective:  Patient ID: Meghan Galvan, female    DOB: [birth year]  Age: 42 y.o. MRN: [id number]  CC: PAP  HPI Chenea Kipper is a 42 y.o. female with a medical history of hypothyroidism presents for a pap smear collection. Endorses frequent vaginal yeast infections. Currently has a small amount of thick white discharge. Does not endorse burning, itching, genital lesions, dyspareunia, pelvic pain, or consitutional symptoms.          Outpatient Medications Prior to Visit  Medication Sig Dispense Refill  . levothyroxine (SYNTHROID, LEVOTHROID) 112 MCG tablet Take 1 tablet (112 mcg total) by mouth daily. 90 tablet 3  . naproxen (NAPROSYN) 500 MG tablet Take 1 tablet (500 mg total) by mouth 2 (two) times daily with a meal. 30 tablet 0  . senna-docusate (SENOKOT-S) 8.6-50 MG tablet Take 1 tablet by mouth daily. 30 tablet 0   No facility-administered medications prior to visit.      ROS Review of Systems  Constitutional: Negative for chills, fever and malaise/fatigue.  Eyes: Negative for blurred vision.  Respiratory: Negative for shortness of breath.   Cardiovascular: Negative for chest pain and palpitations.  Gastrointestinal: Negative for abdominal pain and nausea.  Genitourinary: Negative for dysuria and hematuria.       Vaginal drainage  Musculoskeletal: Negative for joint pain and myalgias.  Skin: Negative for rash.  Neurological: Negative for tingling and headaches.  Psychiatric/Behavioral: Negative for depression. The patient is not nervous/anxious.     Objective:  BP 126/84 (BP Location: Right Arm, Patient Position: Sitting, Cuff Size: Normal)   Pulse 68   Temp 98.1 F (36.7 C) (Oral)   Wt 176 lb 12.8 oz (80.2 kg)   LMP 11/03/2017 (Exact Date)   SpO2 99%   BMI 33.41 kg/m   BP/Weight 11/28/2017 10/31/2017 05/21/2017  Systolic BP 126 120 112  Diastolic BP 84 75 69  Wt. (Lbs) 176.8 171 163.4  BMI 33.41 32.31 31.39      Physical Exam  Constitutional: She is  oriented to person, place, and time. She appears well-developed and well-nourished.  Genitourinary:  Genitourinary Comments: Small amount of thick clumpy discharge. No cervical motion tenderness. No adnexal mass or tenderness bilaterally. No uterine mass or tenderness.  Neurological: She is alert and oriented to person, place, and time.     Assessment & Plan:    1. Screening for cervical cancer - Cytology - PAP([place])  2. Vaginal yeast infection - fluconazole (DIFLUCAN) 150 MG tablet; Take 1 tablet (150 mg total) by mouth once a week.  Dispense: 4 tablet; Refill: 3 - Ambulatory referral to Gynecology   Meds ordered this encounter  Medications  . fluconazole (DIFLUCAN) 150 MG tablet    Sig: Take 1 tablet (150 mg total) by mouth once a week.    Dispense:  4 tablet    Refill:  3    Order Specific Question:   Supervising Provider    Answer:   Quentin Angst [id number]    Follow-up: Return if symptoms worsen or fail to improve.   Loletta Specter PA

## 2017-11-28 NOTE — Patient Instructions (Signed)

## 2017-11-30 LAB — CYTOLOGY - PAP
Bacterial vaginitis: POSITIVE — AB
CANDIDA VAGINITIS: NEGATIVE
Chlamydia: NEGATIVE
Diagnosis: NEGATIVE
NEISSERIA GONORRHEA: NEGATIVE
Trichomonas: NEGATIVE

## 2017-12-02 ENCOUNTER — Telehealth (INDEPENDENT_AMBULATORY_CARE_PROVIDER_SITE_OTHER): Payer: Self-pay

## 2017-12-02 ENCOUNTER — Other Ambulatory Visit (INDEPENDENT_AMBULATORY_CARE_PROVIDER_SITE_OTHER): Payer: Self-pay | Admitting: Physician Assistant

## 2017-12-02 DIAGNOSIS — B9689 Other specified bacterial agents as the cause of diseases classified elsewhere: Secondary | ICD-10-CM

## 2017-12-02 DIAGNOSIS — N760 Acute vaginitis: Principal | ICD-10-CM

## 2017-12-02 MED ORDER — METRONIDAZOLE 500 MG PO TABS: 500.0000 mg | ORAL_TABLET | Freq: Two times a day (BID) | ORAL | 0 refills | Status: AC

## 2017-12-02 NOTE — Telephone Encounter (Signed)
-----   Message from Loletta Specteroger David Gomez, PA-C sent at 12/02/2017  2:03 PM EDT ----- PAP negative for malignancy. Positive for BV. [facility].

## 2017-12-02 NOTE — Telephone Encounter (Signed)
Patient aware of pap negative for malignancy but positive for BV and to pick up metronidazole from [facility]. Maryjean Mornempestt S Longino Trefz, CMA

## 2018-02-25 ENCOUNTER — Ambulatory Visit (INDEPENDENT_AMBULATORY_CARE_PROVIDER_SITE_OTHER): Payer: Self-pay | Admitting: Physician Assistant

## 2018-03-24 ENCOUNTER — Telehealth (INDEPENDENT_AMBULATORY_CARE_PROVIDER_SITE_OTHER): Payer: Self-pay | Admitting: Physician Assistant

## 2018-03-24 NOTE — Telephone Encounter (Signed)
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  Please follow up [phone]  Thank you

## 2018-03-24 NOTE — Telephone Encounter (Signed)
Pt last seen 3-4 months ago. She may schedule appointment for proper evaluation.

## 2018-04-18 ENCOUNTER — Encounter (INDEPENDENT_AMBULATORY_CARE_PROVIDER_SITE_OTHER): Payer: Self-pay | Admitting: Physician Assistant

## 2018-04-18 ENCOUNTER — Other Ambulatory Visit: Payer: Self-pay

## 2018-04-18 ENCOUNTER — Ambulatory Visit (INDEPENDENT_AMBULATORY_CARE_PROVIDER_SITE_OTHER): Payer: Self-pay | Admitting: Physician Assistant

## 2018-04-18 VITALS — BP 134/84 | HR 65 | Temp 98.1°F | Ht 60.5 in | Wt 160.0 lb

## 2018-04-18 DIAGNOSIS — R143 Flatulence: Secondary | ICD-10-CM

## 2018-04-18 DIAGNOSIS — L209 Atopic dermatitis, unspecified: Secondary | ICD-10-CM

## 2018-04-18 DIAGNOSIS — E039 Hypothyroidism, unspecified: Secondary | ICD-10-CM

## 2018-04-18 DIAGNOSIS — R140 Abdominal distension (gaseous): Secondary | ICD-10-CM

## 2018-04-18 MED ORDER — CETAPHIL MOISTURIZING EX CREA: TOPICAL_CREAM | CUTANEOUS | 0 refills | Status: DC | PRN

## 2018-04-18 MED ORDER — CALCIUM CARBONATE-VITAMIN D 600-400 MG-UNIT PO TABS: 2.0000 | ORAL_TABLET | Freq: Every day | ORAL | 5 refills | Status: DC

## 2018-04-18 MED ORDER — TRIAMCINOLONE ACETONIDE 0.5 % EX OINT: 1.0000 "application " | TOPICAL_OINTMENT | Freq: Two times a day (BID) | CUTANEOUS | 1 refills | Status: DC

## 2018-04-18 NOTE — Progress Notes (Signed)
Subjective:  Patient ID: Meghan Galvan, female    DOB: 02/10/1976  Age: 42 y.o. MRN: [id number]  CC: statement of health   HPI Meghan Galvan is a 42 y.o. female with a medical history of hypothyroidism s/p thyroidectomy presents for a "Statement of Health" letter be written for her employer. Denies any history of communicable disease and disability. Generally feels well. Only complaint is of occasional flaring of atopic dermatitis. Has few patches currently on the antecubitals and fingers. Uses OTC moisturizer and has run out of steroid ointment. 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 Also complains of abdominal bloating, abdominal pain, and flatulence after eating dairy products. Does not endorse any other symptoms or complaints.      Outpatient Medications Prior to Visit  Medication Sig Dispense Refill  . levothyroxine (SYNTHROID, LEVOTHROID) 112 MCG tablet Take 1 tablet (112 mcg total) by mouth daily. 90 tablet 3  . senna-docusate (SENOKOT-S) 8.6-50 MG tablet Take 1 tablet by mouth daily. 30 tablet 0  . naproxen (NAPROSYN) 500 MG tablet Take 1 tablet (500 mg total) by mouth 2 (two) times daily with a meal. (Patient not taking: Reported on 04/18/2018) 30 tablet 0  . fluconazole (DIFLUCAN) 150 MG tablet Take 1 tablet (150 mg total) by mouth once a week. 4 tablet 3   No facility-administered medications prior to visit.      ROS Review of Systems  Constitutional: Negative for chills, fever and malaise/fatigue.  Eyes: Negative for blurred vision.  Respiratory: Negative for shortness of breath.   Cardiovascular: Negative for chest pain and palpitations.  Gastrointestinal: Negative for abdominal pain and nausea.  Genitourinary: Negative for dysuria and hematuria.  Musculoskeletal: Negative for joint pain and myalgias.  Skin: Negative for rash.  Neurological: Negative for tingling and headaches.  Psychiatric/Behavioral: Negative for depression. The  patient is not nervous/anxious.     Objective:  BP 134/84 (BP Location: Left Arm, Patient Position: Sitting, Cuff Size: Normal)   Pulse 65   Temp 98.1 F (36.7 C) (Oral)   Ht 5' 0.5" (1.537 m)   Wt 160 lb (72.6 kg)   LMP 03/21/2018 (Exact Date)   SpO2 100%   BMI 30.73 kg/m   BP/Weight 04/18/2018 11/28/2017 10/31/2017  Systolic BP 134 126 120  Diastolic BP 84 84 75  Wt. (Lbs) 160 176.8 171  BMI 30.73 33.41 32.31      Physical Exam  Constitutional: She is oriented to person, place, and time.  Well developed, well nourished, NAD, polite  HENT:  Head: Normocephalic and atraumatic.  Eyes: No scleral icterus.  Neck: Normal range of motion. Neck supple. No thyromegaly present.  Cardiovascular: Normal rate, regular rhythm and normal heart sounds.  Pulmonary/Chest: Effort normal and breath sounds normal.  Abdominal: Soft. Bowel sounds are normal. There is no tenderness.  Musculoskeletal: She exhibits no edema.  Neurological: She is alert and oriented to person, place, and time.  Skin: Skin is warm and dry. No rash noted. No erythema. No pallor.  Psychiatric: She has a normal mood and affect. Her behavior is normal. Thought content normal.  Vitals reviewed.    Assessment & Plan:   1. Hypothyroidism, unspecified type - Calcium Carbonate-Vitamin D (CALTRATE 600+D) 600-400 MG-UNIT tablet; Take 2 tablets by mouth daily.  Dispense: 30 tablet; Refill: 5 - CBC with Differential - Basic Metabolic Panel - Thyroid Panel With TSH  2. Atopic dermatitis, unspecified type - triamcinolone ointment (KENALOG) 0.5 %; Apply 1 application topically 2 (two)  times daily.  Dispense: 30 g; Refill: 1 - Emollient (CETAPHIL) cream; Apply topically as needed.  Dispense: 90 g; Refill: 0  3. Abdominal bloating - CBC with Differential - Basic Metabolic Panel - Thyroid Panel With TSH - Lactose tolerance test; Future  4. Flatulence - CBC with Differential - Basic Metabolic Panel - Thyroid Panel With  TSH - Lactose tolerance test; Future   Meds ordered this encounter  Medications  . triamcinolone ointment (KENALOG) 0.5 %    Sig: Apply 1 application topically 2 (two) times daily.    Dispense:  30 g    Refill:  1    Order Specific Question:   Supervising Provider    Answer:   Hoy Register [[id number]]  . Emollient (CETAPHIL) cream    Sig: Apply topically as needed.    Dispense:  90 g    Refill:  0    Order Specific Question:   Supervising Provider    Answer:   Hoy Register [[id number]]  . Calcium Carbonate-Vitamin D (CALTRATE 600+D) 600-400 MG-UNIT tablet    Sig: Take 2 tablets by mouth daily.    Dispense:  30 tablet    Refill:  5    Order Specific Question:   Supervising Provider    Answer:   Hoy Register [[id number]]    Follow-up: PRN  Loletta Specter PA

## 2018-04-19 LAB — BASIC METABOLIC PANEL
BUN / CREAT RATIO: 8 — AB (ref 9–23)
BUN: 6 mg/dL (ref 6–24)
CHLORIDE: 103 mmol/L (ref 96–106)
CO2: 23 mmol/L (ref 20–29)
Calcium: 9.4 mg/dL (ref 8.7–10.2)
Creatinine, Ser: 0.79 mg/dL (ref 0.57–1.00)
GFR calc Af Amer: 107 mL/min/{1.73_m2} (ref >59)
GFR calc non Af Amer: 93 mL/min/{1.73_m2} (ref >59)
GLUCOSE: 76 mg/dL (ref 65–99)
Potassium: 4 mmol/L (ref 3.5–5.2)
SODIUM: 140 mmol/L (ref 134–144)

## 2018-04-19 LAB — CBC WITH DIFFERENTIAL/PLATELET
BASOS ABS: 0 10*3/uL (ref 0.0–0.2)
Basos: 1 %
EOS (ABSOLUTE): 0.2 10*3/uL (ref 0.0–0.4)
Eos: 4 %
Hematocrit: 38 % (ref 34.0–46.6)
Hemoglobin: 12.2 g/dL (ref 11.1–15.9)
Immature Grans (Abs): 0 10*3/uL (ref 0.0–0.1)
Immature Granulocytes: 0 %
LYMPHS: 37 %
Lymphocytes Absolute: 2 10*3/uL (ref 0.7–3.1)
MCH: 29 pg (ref 26.6–33.0)
MCHC: 32.1 g/dL (ref 31.5–35.7)
MCV: 90 fL (ref 79–97)
MONOS ABS: 0.4 10*3/uL (ref 0.1–0.9)
Monocytes: 8 %
NEUTROS ABS: 2.6 10*3/uL (ref 1.4–7.0)
Neutrophils: 50 %
PLATELETS: 297 10*3/uL (ref 150–450)
RBC: 4.21 x10E6/uL (ref 3.77–5.28)
RDW: 14.3 % (ref 12.3–15.4)
WBC: 5.3 10*3/uL (ref 3.4–10.8)

## 2018-04-19 LAB — THYROID PANEL WITH TSH
Free Thyroxine Index: 1.6 (ref 1.2–4.9)
T3 Uptake Ratio: 26 % (ref 24–39)
T4, Total: 6.2 ug/dL (ref 4.5–12.0)
TSH: 15.17 u[IU]/mL — AB (ref 0.450–4.500)

## 2018-04-21 ENCOUNTER — Telehealth (INDEPENDENT_AMBULATORY_CARE_PROVIDER_SITE_OTHER): Payer: Self-pay

## 2018-04-21 ENCOUNTER — Other Ambulatory Visit (INDEPENDENT_AMBULATORY_CARE_PROVIDER_SITE_OTHER): Payer: Self-pay | Admitting: Physician Assistant

## 2018-04-21 DIAGNOSIS — E039 Hypothyroidism, unspecified: Secondary | ICD-10-CM

## 2018-04-21 MED ORDER — LEVOTHYROXINE SODIUM 125 MCG PO TABS: 125.0000 ug | ORAL_TABLET | Freq: Every day | ORAL | 3 refills | Status: DC

## 2018-04-21 NOTE — Telephone Encounter (Signed)
-----   Message from Loletta Specteroger David Gomez, PA-C sent at 04/21/2018  8:45 AM EDT ----- Pt still has abnormal TSH despite increase of levothyroxine. I recommend increasing synthroid a little more and checking in three months. Sent new dose to [facility] [place] [place] [place].

## 2018-04-21 NOTE — Telephone Encounter (Signed)
Left voicemail informing patient that TSH still remains abnormal despite increasing levothyroxine. PCP recommends increasing a little more and retesting in three months. New dose sent to [facility] [place]. Call the office with any questions. Maryjean Mornempestt S Eros Montour, CMA

## 2019-06-22 ENCOUNTER — Other Ambulatory Visit: Payer: Self-pay

## 2019-06-22 ENCOUNTER — Ambulatory Visit (INDEPENDENT_AMBULATORY_CARE_PROVIDER_SITE_OTHER): Payer: Self-pay | Admitting: Primary Care

## 2019-06-22 VITALS — BP 127/85 | HR 89 | Temp 97.6°F | Ht 61.0 in | Wt 180.4 lb

## 2019-06-22 DIAGNOSIS — K429 Umbilical hernia without obstruction or gangrene: Secondary | ICD-10-CM

## 2019-06-22 DIAGNOSIS — N921 Excessive and frequent menstruation with irregular cycle: Secondary | ICD-10-CM

## 2019-06-22 DIAGNOSIS — E669 Obesity, unspecified: Secondary | ICD-10-CM

## 2019-06-22 DIAGNOSIS — Z6834 Body mass index (BMI) 34.0-34.9, adult: Secondary | ICD-10-CM

## 2019-06-22 DIAGNOSIS — E039 Hypothyroidism, unspecified: Secondary | ICD-10-CM

## 2019-06-22 MED ORDER — LEVOTHYROXINE SODIUM 125 MCG PO TABS: 125.0000 ug | ORAL_TABLET | Freq: Every day | ORAL | 0 refills | Status: DC

## 2019-06-22 NOTE — Patient Instructions (Signed)
Hypothyroidism  Hypothyroidism is when the thyroid gland does not make enough of certain hormones (it is underactive). The thyroid gland is a small gland located in the lower front part of the neck, just in front of the windpipe (trachea). This gland makes hormones that help control how the body uses food for energy (metabolism) as well as how the heart and brain function. These hormones also play a role in keeping your bones strong. When the thyroid is underactive, it produces too little of the hormones thyroxine (T4) and triiodothyronine (T3). What are the causes? This condition may be caused by:  Hashimoto's disease. This is a disease in which the body's disease-fighting system (immune system) attacks the thyroid gland. This is the most common cause.  Viral infections.  Pregnancy.  Certain medicines.  Birth defects.  Past radiation treatments to the head or neck for cancer.  Past treatment with radioactive iodine.  Past exposure to radiation in the environment.  Past surgical removal of part or all of the thyroid.  Problems with a gland in the center of the brain (pituitary gland).  Lack of enough iodine in the diet. What increases the risk? You are more likely to develop this condition if:  You are female.  You have a family history of thyroid conditions.  You use a medicine called lithium.  You take medicines that affect the immune system (immunosuppressants). What are the signs or symptoms? Symptoms of this condition include:  Feeling as though you have no energy (lethargy).  Not being able to tolerate cold.  Weight gain that is not explained by a change in diet or exercise habits.  Lack of appetite.  Dry skin.  Coarse hair.  Menstrual irregularity.  Slowing of thought processes.  Constipation.  Sadness or depression. How is this diagnosed? This condition may be diagnosed based on:  Your symptoms, your medical history, and a physical exam.  Blood  tests. You may also have imaging tests, such as an ultrasound or MRI. How is this treated? This condition is treated with medicine that replaces the thyroid hormones that your body does not make. After you begin treatment, it may take several weeks for symptoms to go away. Follow these instructions at home:  Take over-the-counter and prescription medicines only as told by your health care provider.  If you start taking any new medicines, tell your health care provider.  Keep all follow-up visits as told by your health care provider. This is important. ? As your condition improves, your dosage of thyroid hormone medicine may change. ? You will need to have blood tests regularly so that your health care provider can monitor your condition. Contact a health care provider if:  Your symptoms do not get better with treatment.  You are taking thyroid replacement medicine and you: ? Sweat a lot. ? Have tremors. ? Feel anxious. ? Lose weight rapidly. ? Cannot tolerate heat. ? Have emotional swings. ? Have diarrhea. ? Feel weak. Get help right away if you have:  Chest pain.  An irregular heartbeat.  A rapid heartbeat.  Difficulty breathing. Summary  Hypothyroidism is when the thyroid gland does not make enough of certain hormones (it is underactive).  When the thyroid is underactive, it produces too little of the hormones thyroxine (T4) and triiodothyronine (T3).  The most common cause is Hashimoto's disease, a disease in which the body's disease-fighting system (immune system) attacks the thyroid gland. The condition can also be caused by viral infections, medicine, pregnancy, or past   radiation treatment to the head or neck.  Symptoms may include weight gain, dry skin, constipation, feeling as though you do not have energy, and not being able to tolerate cold.  This condition is treated with medicine to replace the thyroid hormones that your body does not make. This information  is not intended to replace advice given to you by your health care provider. Make sure you discuss any questions you have with your health care provider. Document Released: 09/10/2005 Document Revised: 08/23/2017 Document Reviewed: 08/21/2017 Elsevier Patient Education  2020 Elsevier Inc.  

## 2019-06-22 NOTE — Progress Notes (Signed)
Established Patient Office Visit  Subjective:  Patient ID: Meghan Galvan, female    DOB: 1976/07/17  Age: 43 y.o. MRN: [id number]  CC:  Chief Complaint  Patient presents with  . Establish Care    hypothyroidism    HPI Preslei Blakley presents for the management of hyperthyroidism and requesting medications refills. She is a establish patient art [facility] but establish care with new provider.  Past Medical History:  Diagnosis Date  . Thyroid disease     Past Surgical History:  Procedure Laterality Date  . THYROIDECTOMY  March 2016    No family history on file.  Social History   Socioeconomic History  . Marital status: Single    Spouse name: Not on file  . Number of children: Not on file  . Years of education: Not on file  . Highest education level: Not on file  Occupational History  . Not on file  Social Needs  . Financial resource strain: Not on file  . Food insecurity    Worry: Not on file    Inability: Not on file  . Transportation needs    Medical: Not on file    Non-medical: Not on file  Tobacco Use  . Smoking status: Never Smoker  . Smokeless tobacco: Never Used  Substance and Sexual Activity  . Alcohol use: Yes    Alcohol/week: 4.0 standard drinks    Types: 4 Glasses of wine per week  . Drug use: No  . Sexual activity: Yes  Lifestyle  . Physical activity    Days per week: Not on file    Minutes per session: Not on file  . Stress: Not on file  Relationships  . Social Herbalist on phone: Not on file    Gets together: Not on file    Attends religious service: Not on file    Active member of club or organization: Not on file    Attends meetings of clubs or organizations: Not on file    Relationship status: Not on file  . Intimate partner violence    Fear of current or ex partner: Not on file    Emotionally abused: Not on file    Physically abused: Not on file    Forced sexual activity: Not on file  Other Topics Concern  . Not on  file  Social History Narrative  . Not on file    Outpatient Medications Prior to Visit  Medication Sig Dispense Refill  . Calcium Carbonate-Vitamin D (CALTRATE 600+D) 600-400 MG-UNIT tablet Take 2 tablets by mouth daily. (Patient not taking: Reported on 06/22/2019) 30 tablet 5  . Emollient (CETAPHIL) cream Apply topically as needed. (Patient not taking: Reported on 06/22/2019) 90 g 0  . triamcinolone ointment (KENALOG) 0.5 % Apply 1 application topically 2 (two) times daily. (Patient not taking: Reported on 06/22/2019) 30 g 1  . levothyroxine (SYNTHROID, LEVOTHROID) 125 MCG tablet Take 1 tablet (125 mcg total) by mouth daily. (Patient not taking: Reported on 06/22/2019) 90 tablet 3  . naproxen (NAPROSYN) 500 MG tablet Take 1 tablet (500 mg total) by mouth 2 (two) times daily with a meal. (Patient not taking: Reported on 04/18/2018) 30 tablet 0  . senna-docusate (SENOKOT-S) 8.6-50 MG tablet Take 1 tablet by mouth daily. 30 tablet 0   No facility-administered medications prior to visit.     No Known Allergies  ROS Review of Systems  All other systems reviewed and are negative.     Objective:   Established Patient Office Visit  Subjective:  Patient ID: Meghan Galvan, female    DOB: 06/30/1976  Age: 42 y.o. MRN: [id number]  CC:  Chief Complaint  Patient presents with  . Establish Care    hypothyroidism    HPI Allura Harroun presents for the management of hyperthyroidism and requesting medications refills. She is a establish patient art [facility] but establish care with new provider.  Past Medical History:  Diagnosis Date  . Thyroid disease     Past Surgical History:  Procedure Laterality Date  . THYROIDECTOMY  March 2016    No family history on file.  Social History   Socioeconomic History  . Marital status: Single    Spouse name: Not on file  . Number of children: Not on file  . Years of education: Not on file  . Highest education level: Not on file  Occupational History  . Not on file  Social Needs  . Financial resource strain: Not on file  . Food insecurity    Worry: Not on file    Inability: Not on file  . Transportation needs    Medical: Not on file    Non-medical: Not on file  Tobacco Use  . Smoking status: Never Smoker  . Smokeless tobacco: Never Used  Substance and Sexual Activity  . Alcohol use: Yes    Alcohol/week: 4.0 standard drinks    Types: 4 Glasses of wine per week  . Drug use: No  . Sexual activity: Yes  Lifestyle  . Physical activity    Days per week: Not on file    Minutes per session: Not on file  . Stress: Not on file  Relationships  . Social connections    Talks on phone: Not on file    Gets together: Not on file    Attends religious service: Not on file    Active member of club or organization: Not on file    Attends meetings of clubs or organizations: Not on file    Relationship status: Not on file  . Intimate partner violence    Fear of current or ex partner: Not on file    Emotionally abused: Not on file    Physically abused: Not on file    Forced sexual activity: Not on file  Other Topics Concern  . Not on  file  Social History Narrative  . Not on file    Outpatient Medications Prior to Visit  Medication Sig Dispense Refill  . Calcium Carbonate-Vitamin D (CALTRATE 600+D) 600-400 MG-UNIT tablet Take 2 tablets by mouth daily. (Patient not taking: Reported on 06/22/2019) 30 tablet 5  . Emollient (CETAPHIL) cream Apply topically as needed. (Patient not taking: Reported on 06/22/2019) 90 g 0  . triamcinolone ointment (KENALOG) 0.5 % Apply 1 application topically 2 (two) times daily. (Patient not taking: Reported on 06/22/2019) 30 g 1  . levothyroxine (SYNTHROID, LEVOTHROID) 125 MCG tablet Take 1 tablet (125 mcg total) by mouth daily. (Patient not taking: Reported on 06/22/2019) 90 tablet 3  . naproxen (NAPROSYN) 500 MG tablet Take 1 tablet (500 mg total) by mouth 2 (two) times daily with a meal. (Patient not taking: Reported on 04/18/2018) 30 tablet 0  . senna-docusate (SENOKOT-S) 8.6-50 MG tablet Take 1 tablet by mouth daily. 30 tablet 0   No facility-administered medications prior to visit.     No Known Allergies  ROS Review of Systems  All other systems reviewed and are negative.     Objective:      Established Patient Office Visit  Subjective:  Patient ID: Meghan Galvan, female    DOB: 1976/07/17  Age: 43 y.o. MRN: [id number]  CC:  Chief Complaint  Patient presents with  . Establish Care    hypothyroidism    HPI Preslei Blakley presents for the management of hyperthyroidism and requesting medications refills. She is a establish patient art [facility] but establish care with new provider.  Past Medical History:  Diagnosis Date  . Thyroid disease     Past Surgical History:  Procedure Laterality Date  . THYROIDECTOMY  March 2016    No family history on file.  Social History   Socioeconomic History  . Marital status: Single    Spouse name: Not on file  . Number of children: Not on file  . Years of education: Not on file  . Highest education level: Not on file  Occupational History  . Not on file  Social Needs  . Financial resource strain: Not on file  . Food insecurity    Worry: Not on file    Inability: Not on file  . Transportation needs    Medical: Not on file    Non-medical: Not on file  Tobacco Use  . Smoking status: Never Smoker  . Smokeless tobacco: Never Used  Substance and Sexual Activity  . Alcohol use: Yes    Alcohol/week: 4.0 standard drinks    Types: 4 Glasses of wine per week  . Drug use: No  . Sexual activity: Yes  Lifestyle  . Physical activity    Days per week: Not on file    Minutes per session: Not on file  . Stress: Not on file  Relationships  . Social Herbalist on phone: Not on file    Gets together: Not on file    Attends religious service: Not on file    Active member of club or organization: Not on file    Attends meetings of clubs or organizations: Not on file    Relationship status: Not on file  . Intimate partner violence    Fear of current or ex partner: Not on file    Emotionally abused: Not on file    Physically abused: Not on file    Forced sexual activity: Not on file  Other Topics Concern  . Not on  file  Social History Narrative  . Not on file    Outpatient Medications Prior to Visit  Medication Sig Dispense Refill  . Calcium Carbonate-Vitamin D (CALTRATE 600+D) 600-400 MG-UNIT tablet Take 2 tablets by mouth daily. (Patient not taking: Reported on 06/22/2019) 30 tablet 5  . Emollient (CETAPHIL) cream Apply topically as needed. (Patient not taking: Reported on 06/22/2019) 90 g 0  . triamcinolone ointment (KENALOG) 0.5 % Apply 1 application topically 2 (two) times daily. (Patient not taking: Reported on 06/22/2019) 30 g 1  . levothyroxine (SYNTHROID, LEVOTHROID) 125 MCG tablet Take 1 tablet (125 mcg total) by mouth daily. (Patient not taking: Reported on 06/22/2019) 90 tablet 3  . naproxen (NAPROSYN) 500 MG tablet Take 1 tablet (500 mg total) by mouth 2 (two) times daily with a meal. (Patient not taking: Reported on 04/18/2018) 30 tablet 0  . senna-docusate (SENOKOT-S) 8.6-50 MG tablet Take 1 tablet by mouth daily. 30 tablet 0   No facility-administered medications prior to visit.     No Known Allergies  ROS Review of Systems  All other systems reviewed and are negative.     Objective:

## 2019-06-23 ENCOUNTER — Other Ambulatory Visit (INDEPENDENT_AMBULATORY_CARE_PROVIDER_SITE_OTHER): Payer: Self-pay | Admitting: Primary Care

## 2019-06-23 DIAGNOSIS — E039 Hypothyroidism, unspecified: Secondary | ICD-10-CM

## 2019-06-23 LAB — CBC WITH DIFFERENTIAL/PLATELET
Basophils Absolute: 0.1 10*3/uL (ref 0.0–0.2)
Basos: 1 %
EOS (ABSOLUTE): 0.2 10*3/uL (ref 0.0–0.4)
Eos: 2 %
Hematocrit: 35.7 % (ref 34.0–46.6)
Hemoglobin: 12 g/dL (ref 11.1–15.9)
Immature Grans (Abs): 0 10*3/uL (ref 0.0–0.1)
Immature Granulocytes: 0 %
Lymphocytes Absolute: 3 10*3/uL (ref 0.7–3.1)
Lymphs: 45 %
MCH: 30.2 pg (ref 26.6–33.0)
MCHC: 33.6 g/dL (ref 31.5–35.7)
MCV: 90 fL (ref 79–97)
Monocytes Absolute: 0.4 10*3/uL (ref 0.1–0.9)
Monocytes: 7 %
Neutrophils Absolute: 3 10*3/uL (ref 1.4–7.0)
Neutrophils: 45 %
Platelets: 284 10*3/uL (ref 150–450)
RBC: 3.97 x10E6/uL (ref 3.77–5.28)
RDW: 14.8 % (ref 11.7–15.4)
WBC: 6.6 10*3/uL (ref 3.4–10.8)

## 2019-06-23 LAB — CMP14+EGFR
ALT: 25 IU/L (ref 0–32)
AST: 43 IU/L — ABNORMAL HIGH (ref 0–40)
Albumin/Globulin Ratio: 2 (ref 1.2–2.2)
Albumin: 4.9 g/dL — ABNORMAL HIGH (ref 3.8–4.8)
Alkaline Phosphatase: 42 IU/L (ref 39–117)
BUN/Creatinine Ratio: 10 (ref 9–23)
BUN: 10 mg/dL (ref 6–24)
Bilirubin Total: 1 mg/dL (ref 0.0–1.2)
CO2: 24 mmol/L (ref 20–29)
Calcium: 9.7 mg/dL (ref 8.7–10.2)
Chloride: 98 mmol/L (ref 96–106)
Creatinine, Ser: 1 mg/dL (ref 0.57–1.00)
GFR calc Af Amer: 80 mL/min/{1.73_m2} (ref >59)
GFR calc non Af Amer: 69 mL/min/{1.73_m2} (ref >59)
Globulin, Total: 2.5 g/dL (ref 1.5–4.5)
Glucose: 87 mg/dL (ref 65–99)
Potassium: 3.8 mmol/L (ref 3.5–5.2)
Sodium: 136 mmol/L (ref 134–144)
Total Protein: 7.4 g/dL (ref 6.0–8.5)

## 2019-06-23 LAB — TSH+FREE T4
Free T4: 0.14 ng/dL — ABNORMAL LOW (ref 0.82–1.77)
TSH: 88.1 u[IU]/mL — ABNORMAL HIGH (ref 0.450–4.500)

## 2019-06-23 MED ORDER — LEVOTHYROXINE SODIUM 125 MCG PO TABS: 125.0000 ug | ORAL_TABLET | Freq: Every day | ORAL | 0 refills | Status: DC

## 2019-10-16 ENCOUNTER — Other Ambulatory Visit (INDEPENDENT_AMBULATORY_CARE_PROVIDER_SITE_OTHER): Payer: Self-pay

## 2019-10-16 ENCOUNTER — Telehealth (INDEPENDENT_AMBULATORY_CARE_PROVIDER_SITE_OTHER): Payer: Self-pay

## 2019-10-16 ENCOUNTER — Other Ambulatory Visit (INDEPENDENT_AMBULATORY_CARE_PROVIDER_SITE_OTHER): Payer: Self-pay | Admitting: Primary Care

## 2019-10-16 ENCOUNTER — Other Ambulatory Visit: Payer: Self-pay

## 2019-10-16 DIAGNOSIS — E039 Hypothyroidism, unspecified: Secondary | ICD-10-CM

## 2019-10-16 NOTE — Telephone Encounter (Signed)
Patient aware that she needs thyroid rechecked before she can get refill. She was supposed to have it done in 6 weeks from the last abnormal panel in September. Patient is coming in today for labs. Please place order.

## 2019-10-16 NOTE — Telephone Encounter (Signed)
Patient called to make a medication refill for   levothyroxine (SYNTHROID) 125 MCG tablet   Patient uses [facility] [address], [place], [place] [address]  Please advice [phone]

## 2019-10-17 ENCOUNTER — Other Ambulatory Visit (INDEPENDENT_AMBULATORY_CARE_PROVIDER_SITE_OTHER): Payer: Self-pay | Admitting: Primary Care

## 2019-10-17 DIAGNOSIS — E039 Hypothyroidism, unspecified: Secondary | ICD-10-CM

## 2019-10-17 LAB — TSH+FREE T4
Free T4: 0.52 ng/dL — ABNORMAL LOW (ref 0.82–1.77)
TSH: 38.2 u[IU]/mL — ABNORMAL HIGH (ref 0.450–4.500)

## 2019-10-18 ENCOUNTER — Other Ambulatory Visit (INDEPENDENT_AMBULATORY_CARE_PROVIDER_SITE_OTHER): Payer: Self-pay | Admitting: Primary Care

## 2019-10-18 DIAGNOSIS — E039 Hypothyroidism, unspecified: Secondary | ICD-10-CM

## 2019-10-18 MED ORDER — LEVOTHYROXINE SODIUM 125 MCG PO TABS: 125.0000 ug | ORAL_TABLET | Freq: Every day | ORAL | 0 refills | Status: DC

## 2019-10-19 ENCOUNTER — Other Ambulatory Visit (INDEPENDENT_AMBULATORY_CARE_PROVIDER_SITE_OTHER): Payer: Self-pay | Admitting: Primary Care

## 2019-10-19 ENCOUNTER — Telehealth (INDEPENDENT_AMBULATORY_CARE_PROVIDER_SITE_OTHER): Payer: Self-pay

## 2019-10-19 DIAGNOSIS — E039 Hypothyroidism, unspecified: Secondary | ICD-10-CM

## 2019-10-19 MED ORDER — LEVOTHYROXINE SODIUM 125 MCG PO TABS: 125.0000 ug | ORAL_TABLET | Freq: Every day | ORAL | 1 refills | Status: DC

## 2019-10-19 NOTE — Telephone Encounter (Signed)
Patient called to inform PCP she does not use [facility] on [address] and would like RX to go to [facility] on [address]. Please look at prior [employer] of medication refill for pharmacy of choose.  Please send is appropriate [phone]  Thank you [name]

## 2019-10-20 ENCOUNTER — Ambulatory Visit (INDEPENDENT_AMBULATORY_CARE_PROVIDER_SITE_OTHER): Payer: Self-pay | Admitting: Primary Care

## 2019-10-20 NOTE — Telephone Encounter (Signed)
Medications have been sent to the correct pharmacy 

## 2019-10-20 NOTE — Telephone Encounter (Signed)
Patient would like a 90 day supply.

## 2019-10-30 ENCOUNTER — Other Ambulatory Visit (INDEPENDENT_AMBULATORY_CARE_PROVIDER_SITE_OTHER): Payer: Self-pay | Admitting: Primary Care

## 2019-10-30 DIAGNOSIS — E039 Hypothyroidism, unspecified: Secondary | ICD-10-CM

## 2019-10-30 MED ORDER — LEVOTHYROXINE SODIUM 125 MCG PO TABS: 125.0000 ug | ORAL_TABLET | Freq: Every day | ORAL | 1 refills | Status: DC

## 2019-11-02 ENCOUNTER — Other Ambulatory Visit (INDEPENDENT_AMBULATORY_CARE_PROVIDER_SITE_OTHER): Payer: Self-pay | Admitting: Primary Care

## 2019-11-02 DIAGNOSIS — E039 Hypothyroidism, unspecified: Secondary | ICD-10-CM

## 2019-12-11 ENCOUNTER — Other Ambulatory Visit (INDEPENDENT_AMBULATORY_CARE_PROVIDER_SITE_OTHER): Payer: Self-pay

## 2019-12-11 ENCOUNTER — Other Ambulatory Visit: Payer: Self-pay

## 2019-12-11 DIAGNOSIS — E039 Hypothyroidism, unspecified: Secondary | ICD-10-CM

## 2019-12-12 LAB — TSH+FREE T4
Free T4: 1.76 ng/dL (ref 0.82–1.77)
TSH: 0.182 u[IU]/mL — ABNORMAL LOW (ref 0.450–4.500)

## 2019-12-23 ENCOUNTER — Other Ambulatory Visit (INDEPENDENT_AMBULATORY_CARE_PROVIDER_SITE_OTHER): Payer: Self-pay | Admitting: Primary Care

## 2019-12-23 DIAGNOSIS — E039 Hypothyroidism, unspecified: Secondary | ICD-10-CM

## 2019-12-23 NOTE — Telephone Encounter (Signed)
Sent to PCP ?

## 2020-01-02 ENCOUNTER — Other Ambulatory Visit (INDEPENDENT_AMBULATORY_CARE_PROVIDER_SITE_OTHER): Payer: Self-pay | Admitting: Primary Care

## 2020-01-02 DIAGNOSIS — E039 Hypothyroidism, unspecified: Secondary | ICD-10-CM

## 2020-02-13 ENCOUNTER — Other Ambulatory Visit (INDEPENDENT_AMBULATORY_CARE_PROVIDER_SITE_OTHER): Payer: Self-pay | Admitting: Primary Care

## 2020-02-13 DIAGNOSIS — E039 Hypothyroidism, unspecified: Secondary | ICD-10-CM

## 2020-02-15 NOTE — Telephone Encounter (Signed)
Last TSH was in march. Does she need TSH? Please delete duplicate orders in chart as well.

## 2020-02-16 ENCOUNTER — Other Ambulatory Visit (INDEPENDENT_AMBULATORY_CARE_PROVIDER_SITE_OTHER): Payer: Self-pay | Admitting: Primary Care

## 2020-02-16 DIAGNOSIS — E039 Hypothyroidism, unspecified: Secondary | ICD-10-CM

## 2020-02-16 MED ORDER — LEVOTHYROXINE SODIUM 150 MCG PO TABS: 150.0000 ug | ORAL_TABLET | Freq: Every day | ORAL | 0 refills | Status: DC

## 2020-02-16 NOTE — Telephone Encounter (Signed)
Needs labs

## 2020-02-17 NOTE — Telephone Encounter (Signed)
Will ck labs before this dose is complete

## 2020-04-04 ENCOUNTER — Other Ambulatory Visit (INDEPENDENT_AMBULATORY_CARE_PROVIDER_SITE_OTHER): Payer: Self-pay | Admitting: Primary Care

## 2020-04-04 DIAGNOSIS — E039 Hypothyroidism, unspecified: Secondary | ICD-10-CM

## 2020-04-04 MED ORDER — LEVOTHYROXINE SODIUM 150 MCG PO TABS: ORAL_TABLET | ORAL | 0 refills | Status: DC

## 2020-04-04 NOTE — Telephone Encounter (Signed)
Patient has appointment tomorrow- will need labs for TSH

## 2020-04-04 NOTE — Telephone Encounter (Signed)
Medication: levothyroxine (SYNTHROID) 150 MCG tablet [[id number]]  Has the patient contacted their pharmacy? YES (Agent: If no, request that the patient contact the pharmacy for the refill.) (Agent: If yes, when and what did the pharmacy advise?)  Preferred Pharmacy (with phone number or street name): [facility] [address] [place], [place]  Agent: Please be advised that RX refills may take up to 3 business days. We ask that you follow-up with your pharmacy.

## 2020-04-05 ENCOUNTER — Other Ambulatory Visit: Payer: Self-pay

## 2020-04-05 ENCOUNTER — Encounter (INDEPENDENT_AMBULATORY_CARE_PROVIDER_SITE_OTHER): Payer: Self-pay | Admitting: Primary Care

## 2020-04-05 ENCOUNTER — Ambulatory Visit (INDEPENDENT_AMBULATORY_CARE_PROVIDER_SITE_OTHER): Payer: Self-pay | Admitting: Primary Care

## 2020-04-05 ENCOUNTER — Other Ambulatory Visit (HOSPITAL_COMMUNITY)
Admission: RE | Admit: 2020-04-05 | Discharge: 2020-04-05 | Disposition: A | Discharge status: Home / Self Care | Payer: Self-pay | Source: Ambulatory Visit | Attending: Primary Care | Admitting: Primary Care

## 2020-04-05 VITALS — BP 143/90 | HR 74 | Temp 98.0°F | Ht 61.0 in | Wt 168.6 lb

## 2020-04-05 DIAGNOSIS — Z640 Problems related to unwanted pregnancy: Secondary | ICD-10-CM

## 2020-04-05 DIAGNOSIS — Z0000 Encounter for general adult medical examination without abnormal findings: Secondary | ICD-10-CM

## 2020-04-05 DIAGNOSIS — N898 Other specified noninflammatory disorders of vagina: Secondary | ICD-10-CM | POA: Insufficient documentation

## 2020-04-05 DIAGNOSIS — Z202 Contact with and (suspected) exposure to infections with a predominantly sexual mode of transmission: Secondary | ICD-10-CM

## 2020-04-05 DIAGNOSIS — Z1159 Encounter for screening for other viral diseases: Secondary | ICD-10-CM

## 2020-04-05 DIAGNOSIS — E039 Hypothyroidism, unspecified: Secondary | ICD-10-CM

## 2020-04-05 NOTE — Patient Instructions (Signed)
Hypothyroidism  Hypothyroidism is when the thyroid gland does not make enough of certain hormones (it is underactive). The thyroid gland is a small gland located in the lower front part of the neck, just in front of the windpipe (trachea). This gland makes hormones that help control how the body uses food for energy (metabolism) as well as how the heart and brain function. These hormones also play a role in keeping your bones strong. When the thyroid is underactive, it produces too little of the hormones thyroxine (T4) and triiodothyronine (T3). What are the causes? This condition may be caused by:  Hashimoto's disease. This is a disease in which the body's disease-fighting system (immune system) attacks the thyroid gland. This is the most common cause.  Viral infections.  Pregnancy.  Certain medicines.  Birth defects.  Past radiation treatments to the head or neck for cancer.  Past treatment with radioactive iodine.  Past exposure to radiation in the environment.  Past surgical removal of part or all of the thyroid.  Problems with a gland in the center of the brain (pituitary gland).  Lack of enough iodine in the diet. What increases the risk? You are more likely to develop this condition if:  You are female.  You have a family history of thyroid conditions.  You use a medicine called lithium.  You take medicines that affect the immune system (immunosuppressants). What are the signs or symptoms? Symptoms of this condition include:  Feeling as though you have no energy (lethargy).  Not being able to tolerate cold.  Weight gain that is not explained by a change in diet or exercise habits.  Lack of appetite.  Dry skin.  Coarse hair.  Menstrual irregularity.  Slowing of thought processes.  Constipation.  Sadness or depression. How is this diagnosed? This condition may be diagnosed based on:  Your symptoms, your medical history, and a physical exam.  Blood  tests. You may also have imaging tests, such as an ultrasound or MRI. How is this treated? This condition is treated with medicine that replaces the thyroid hormones that your body does not make. After you begin treatment, it may take several weeks for symptoms to go away. Follow these instructions at home:  Take over-the-counter and prescription medicines only as told by your health care provider.  If you start taking any new medicines, tell your health care provider.  Keep all follow-up visits as told by your health care provider. This is important. ? As your condition improves, your dosage of thyroid hormone medicine may change. ? You will need to have blood tests regularly so that your health care provider can monitor your condition. Contact a health care provider if:  Your symptoms do not get better with treatment.  You are taking thyroid replacement medicine and you: ? Sweat a lot. ? Have tremors. ? Feel anxious. ? Lose weight rapidly. ? Cannot tolerate heat. ? Have emotional swings. ? Have diarrhea. ? Feel weak. Get help right away if you have:  Chest pain.  An irregular heartbeat.  A rapid heartbeat.  Difficulty breathing. Summary  Hypothyroidism is when the thyroid gland does not make enough of certain hormones (it is underactive).  When the thyroid is underactive, it produces too little of the hormones thyroxine (T4) and triiodothyronine (T3).  The most common cause is Hashimoto's disease, a disease in which the body's disease-fighting system (immune system) attacks the thyroid gland. The condition can also be caused by viral infections, medicine, pregnancy, or past   radiation treatment to the head or neck.  Symptoms may include weight gain, dry skin, constipation, feeling as though you do not have energy, and not being able to tolerate cold.  This condition is treated with medicine to replace the thyroid hormones that your body does not make. This information  is not intended to replace advice given to you by your health care provider. Make sure you discuss any questions you have with your health care provider. Document Revised: 08/23/2017 Document Reviewed: 08/21/2017 Elsevier Patient Education  2020 Elsevier Inc.  
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
## 2020-04-06 ENCOUNTER — Other Ambulatory Visit (INDEPENDENT_AMBULATORY_CARE_PROVIDER_SITE_OTHER): Payer: Self-pay | Admitting: Primary Care

## 2020-04-06 LAB — CERVICOVAGINAL ANCILLARY ONLY
Bacterial Vaginitis (gardnerella): POSITIVE — AB
Candida Glabrata: NEGATIVE
Candida Vaginitis: NEGATIVE
Chlamydia: NEGATIVE
Comment: NEGATIVE
Comment: NEGATIVE
Comment: NEGATIVE
Comment: NEGATIVE
Comment: NEGATIVE
Comment: NORMAL
Neisseria Gonorrhea: NEGATIVE
Trichomonas: NEGATIVE

## 2020-04-06 LAB — COMPREHENSIVE METABOLIC PANEL WITH GFR
ALT: 8 IU/L (ref 0–32)
AST: 15 IU/L (ref 0–40)
Albumin/Globulin Ratio: 1.8 (ref 1.2–2.2)
Albumin: 4.5 g/dL (ref 3.8–4.8)
Alkaline Phosphatase: 53 IU/L (ref 48–121)
BUN/Creatinine Ratio: 16 (ref 9–23)
BUN: 11 mg/dL (ref 6–24)
Bilirubin Total: 0.4 mg/dL (ref 0.0–1.2)
CO2: 23 mmol/L (ref 20–29)
Calcium: 10.3 mg/dL — ABNORMAL HIGH (ref 8.7–10.2)
Chloride: 102 mmol/L (ref 96–106)
Creatinine, Ser: 0.68 mg/dL (ref 0.57–1.00)
GFR calc Af Amer: 123 mL/min/1.73
GFR calc non Af Amer: 107 mL/min/1.73
Globulin, Total: 2.5 g/dL (ref 1.5–4.5)
Glucose: 83 mg/dL (ref 65–99)
Potassium: 4.5 mmol/L (ref 3.5–5.2)
Sodium: 138 mmol/L (ref 134–144)
Total Protein: 7 g/dL (ref 6.0–8.5)

## 2020-04-06 LAB — TSH+FREE T4
Free T4: 0.46 ng/dL — ABNORMAL LOW (ref 0.82–1.77)
TSH: 15.4 u[IU]/mL — ABNORMAL HIGH (ref 0.450–4.500)

## 2020-04-06 LAB — HEPATITIS C ANTIBODY: Hep C Virus Ab: <0.1 s/co ratio (ref 0.0–0.9)

## 2020-04-06 LAB — HIV ANTIBODY (ROUTINE TESTING W REFLEX): HIV Screen 4th Generation wRfx: NONREACTIVE

## 2020-04-06 LAB — RPR: RPR Ser Ql: NONREACTIVE

## 2020-04-06 MED ORDER — METRONIDAZOLE 500 MG PO TABS: 500.0000 mg | ORAL_TABLET | Freq: Two times a day (BID) | ORAL | 0 refills | Status: DC

## 2020-06-06 ENCOUNTER — Other Ambulatory Visit (INDEPENDENT_AMBULATORY_CARE_PROVIDER_SITE_OTHER): Payer: Self-pay

## 2020-07-04 ENCOUNTER — Other Ambulatory Visit (INDEPENDENT_AMBULATORY_CARE_PROVIDER_SITE_OTHER): Payer: Self-pay | Admitting: Primary Care

## 2020-07-04 DIAGNOSIS — E039 Hypothyroidism, unspecified: Secondary | ICD-10-CM

## 2020-11-11 ENCOUNTER — Other Ambulatory Visit (INDEPENDENT_AMBULATORY_CARE_PROVIDER_SITE_OTHER): Payer: Self-pay | Admitting: Primary Care

## 2020-11-11 DIAGNOSIS — E039 Hypothyroidism, unspecified: Secondary | ICD-10-CM

## 2021-05-13 ENCOUNTER — Encounter (HOSPITAL_BASED_OUTPATIENT_CLINIC_OR_DEPARTMENT_OTHER): Payer: Self-pay

## 2021-05-13 ENCOUNTER — Emergency Department (HOSPITAL_BASED_OUTPATIENT_CLINIC_OR_DEPARTMENT_OTHER)
Admission: EM | Admit: 2021-05-13 | Discharge: 2021-05-13 | Disposition: A | Discharge status: Home / Self Care | Payer: Self-pay | Attending: Emergency Medicine | Admitting: Emergency Medicine

## 2021-05-13 ENCOUNTER — Other Ambulatory Visit: Payer: Self-pay

## 2021-05-13 DIAGNOSIS — Z5321 Procedure and treatment not carried out due to patient leaving prior to being seen by health care provider: Secondary | ICD-10-CM | POA: Insufficient documentation

## 2021-05-13 DIAGNOSIS — M545 Low back pain, unspecified: Secondary | ICD-10-CM | POA: Insufficient documentation

## 2021-05-13 NOTE — ED Notes (Addendum)
Pt's name called for room, no answer.

## 2021-05-13 NOTE — ED Notes (Signed)
This RN went through the lobby to find patient. No response.

## 2021-05-13 NOTE — ED Notes (Signed)
Called pt by name for room x2, no answer from lobby.
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
## 2021-05-14 ENCOUNTER — Emergency Department (HOSPITAL_COMMUNITY): Payer: Self-pay

## 2021-05-14 ENCOUNTER — Encounter (HOSPITAL_COMMUNITY): Payer: Self-pay

## 2021-05-14 ENCOUNTER — Emergency Department (HOSPITAL_COMMUNITY)
Admission: EM | Admit: 2021-05-14 | Discharge: 2021-05-14 | Disposition: A | Discharge status: Home / Self Care | Payer: Self-pay | Attending: Emergency Medicine | Admitting: Emergency Medicine

## 2021-05-14 DIAGNOSIS — Z79899 Other long term (current) drug therapy: Secondary | ICD-10-CM | POA: Diagnosis not present

## 2021-05-14 DIAGNOSIS — E039 Hypothyroidism, unspecified: Secondary | ICD-10-CM | POA: Insufficient documentation

## 2021-05-14 DIAGNOSIS — M545 Low back pain, unspecified: Secondary | ICD-10-CM | POA: Diagnosis not present

## 2021-05-14 DIAGNOSIS — Y92410 Unspecified street and highway as the place of occurrence of the external cause: Secondary | ICD-10-CM | POA: Diagnosis not present

## 2021-05-14 DIAGNOSIS — M79651 Pain in right thigh: Secondary | ICD-10-CM | POA: Insufficient documentation

## 2021-05-14 DIAGNOSIS — R1031 Right lower quadrant pain: Secondary | ICD-10-CM | POA: Insufficient documentation

## 2021-05-14 DIAGNOSIS — F17210 Nicotine dependence, cigarettes, uncomplicated: Secondary | ICD-10-CM | POA: Insufficient documentation

## 2021-05-14 LAB — CBC WITH DIFFERENTIAL/PLATELET
Abs Immature Granulocytes: 0.01 10*3/uL (ref 0.00–0.07)
Basophils Absolute: 0 10*3/uL (ref 0.0–0.1)
Basophils Relative: 1 %
Eosinophils Absolute: 0.1 10*3/uL (ref 0.0–0.5)
Eosinophils Relative: 3 %
HCT: 39.6 % (ref 36.0–46.0)
Hemoglobin: 13 g/dL (ref 12.0–15.0)
Immature Granulocytes: 0 %
Lymphocytes Relative: 44 %
Lymphs Abs: 2 10*3/uL (ref 0.7–4.0)
MCH: 29.7 pg (ref 26.0–34.0)
MCHC: 32.8 g/dL (ref 30.0–36.0)
MCV: 90.4 fL (ref 80.0–100.0)
Monocytes Absolute: 0.3 10*3/uL (ref 0.1–1.0)
Monocytes Relative: 8 %
Neutro Abs: 2 10*3/uL (ref 1.7–7.7)
Neutrophils Relative %: 44 %
Platelets: 276 10*3/uL (ref 150–400)
RBC: 4.38 MIL/uL (ref 3.87–5.11)
RDW: 13.7 % (ref 11.5–15.5)
WBC: 4.5 10*3/uL (ref 4.0–10.5)
nRBC: 0 % (ref 0.0–0.2)

## 2021-05-14 LAB — BASIC METABOLIC PANEL
Anion gap: 7 (ref 5–15)
BUN: 11 mg/dL (ref 6–20)
CO2: 26 mmol/L (ref 22–32)
Calcium: 9.1 mg/dL (ref 8.9–10.3)
Chloride: 105 mmol/L (ref 98–111)
Creatinine, Ser: 0.62 mg/dL (ref 0.44–1.00)
GFR, Estimated: >60 mL/min (ref >60)
Glucose, Bld: 91 mg/dL (ref 70–99)
Potassium: 3.9 mmol/L (ref 3.5–5.1)
Sodium: 138 mmol/L (ref 135–145)

## 2021-05-14 LAB — PREGNANCY, URINE: Preg Test, Ur: NEGATIVE

## 2021-05-14 MED ORDER — IOHEXOL 350 MG/ML SOLN
80.0000 mL | Freq: Once | INTRAVENOUS | Status: AC | PRN
  Administered 2021-05-14: 80 mL via INTRAVENOUS

## 2021-05-14 MED ORDER — KETOROLAC TROMETHAMINE 15 MG/ML IJ SOLN
30.0000 mg | Freq: Once | INTRAMUSCULAR | Status: AC
  Administered 2021-05-14: 30 mg via INTRAMUSCULAR
  Filled 2021-05-14: qty 2
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
## 2021-05-14 NOTE — ED Notes (Signed)
Pt discharged. Instructions given. AAOX4. Pt in no apparent distress with mild pain. The opportunity to ask questions was provided.  

## 2021-05-14 NOTE — Discharge Instructions (Addendum)
Your CT of the abdomen and pelvis and lumbar spine did not reveal any evidence of acute fracture or abnormality in your abdomen or pelvis.  No evidence of traumatic injury.  Your symptoms are likely musculoskeletal in nature.  Recommend Tylenol and ibuprofen for for the next 3 days for pain control as needed muscle aches and pains will likely worsen over the next day before they get better.

## 2021-05-14 NOTE — ED Notes (Signed)
Call placed to ED CT regarding missing scan results. Awaiting further feed back.
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
## 2021-05-18 ENCOUNTER — Ambulatory Visit (INDEPENDENT_AMBULATORY_CARE_PROVIDER_SITE_OTHER): Payer: Self-pay | Admitting: *Deleted
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
## 2021-05-18 NOTE — Telephone Encounter (Signed)
Chart opened in error . Patient appt already scheduled for 05/22/21 to see PCP.
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
## 2021-05-22 ENCOUNTER — Other Ambulatory Visit (INDEPENDENT_AMBULATORY_CARE_PROVIDER_SITE_OTHER): Payer: Self-pay | Admitting: Primary Care

## 2021-05-22 ENCOUNTER — Encounter (INDEPENDENT_AMBULATORY_CARE_PROVIDER_SITE_OTHER): Payer: Self-pay | Admitting: Primary Care

## 2021-05-22 ENCOUNTER — Ambulatory Visit (INDEPENDENT_AMBULATORY_CARE_PROVIDER_SITE_OTHER): Payer: Self-pay | Admitting: Primary Care

## 2021-05-22 ENCOUNTER — Other Ambulatory Visit: Payer: Self-pay

## 2021-05-22 VITALS — BP 121/74 | HR 78 | Temp 97.5°F | Ht 61.0 in | Wt 161.8 lb

## 2021-05-22 DIAGNOSIS — M545 Low back pain, unspecified: Secondary | ICD-10-CM

## 2021-05-22 DIAGNOSIS — E039 Hypothyroidism, unspecified: Secondary | ICD-10-CM

## 2021-05-22 DIAGNOSIS — E6609 Other obesity due to excess calories: Secondary | ICD-10-CM

## 2021-05-22 DIAGNOSIS — Z6830 Body mass index (BMI) 30.0-30.9, adult: Secondary | ICD-10-CM

## 2021-05-22 MED ORDER — LEVOTHYROXINE SODIUM 150 MCG PO TABS: ORAL_TABLET | ORAL | 0 refills | Status: DC

## 2021-05-22 MED ORDER — IBUPROFEN 600 MG PO TABS: 600.0000 mg | ORAL_TABLET | Freq: Three times a day (TID) | ORAL | 0 refills | Status: DC | PRN

## 2021-05-22 NOTE — Patient Instructions (Addendum)
Calorie Counting for Weight Loss Calories are units of energy. Your body needs a certain number of calories from food to keep going throughout the day. When you eat or drink more calories than your body needs, your body stores the extra calories mostly as fat. When you eat or drink fewer calories than your body needs, your body burns fat to getthe energy it needs. Calorie counting means keeping track of how many calories you eat and drink each day. Calorie counting can be helpful if you need to lose weight. If you eat fewer calories than your body needs, you should lose weight. Ask yourhealth care provider what a healthy weight is for you. For calorie counting to work, you will need to eat the right number of calories each day to lose a healthy amount of weight per week. A dietitian can help you figure out how many calories you need in a day and will suggest ways to reach your calorie goal. A healthy amount of weight to lose each week is usually 1-2 lb (0.5-0.9 kg). This usually means that your daily calorie intake should be reduced by 500-750 calories. Eating 1,200-1,500 calories a day can help most women lose weight. Eating 1,500-1,800 calories a day can help most men lose weight. What do I need to know about calorie counting? Work with your health care provider or dietitian to determine how many calories you should get each day. To meet your daily calorie goal, you will need to: Find out how many calories are in each food that you would like to eat. Try to do this before you eat. Decide how much of the food you plan to eat. Keep a food log. Do this by writing down what you ate and how many calories it had. To successfully lose weight, it is important to balance calorie counting with ahealthy lifestyle that includes regular activity. Where do I find calorie information?  The number of calories in a food can be found on a Nutrition Facts label. If a food does not have a Nutrition Facts label, try to  look up the calories onlineor ask your dietitian for help. Remember that calories are listed per serving. If you choose to have more than one serving of a food, you will have to multiply the calories per serving by the number of servings you plan to eat. For example, the label on a package of bread might say that a serving size is 1 slice and that there are 90 calories in a serving. If you eat 1 slice, you will have eaten 90 calories. If you eat 2slices, you will have eaten 180 calories. How do I keep a food log? After each time that you eat, record the following in your food log as soon as possible: What you ate. Be sure to include toppings, sauces, and other extras on the food. How much you ate. This can be measured in cups, ounces, or number of items. How many calories were in each food and drink. The total number of calories in the food you ate. Keep your food log near you, such as in a pocket-sized notebook or on an app or website on your mobile phone. Some programs will calculate calories for you andshow you how many calories you have left to meet your daily goal. What are some portion-control tips? Know how many calories are in a serving. This will help you know how many servings you can have of a certain food. Use a measuring cup to   measure serving sizes. You could also try weighing out portions on a kitchen scale. With time, you will be able to estimate serving sizes for some foods. Take time to put servings of different foods on your favorite plates or in your favorite bowls and cups so you know what a serving looks like. Try not to eat straight from a food's packaging, such as from a bag or box. Eating straight from the package makes it hard to see how much you are eating and can lead to overeating. Put the amount you would like to eat in a cup or on a plate to make sure you are eating the right portion. Use smaller plates, glasses, and bowls for smaller portions and to prevent  overeating. Try not to multitask. For example, avoid watching TV or using your computer while eating. If it is time to eat, sit down at a table and enjoy your food. This will help you recognize when you are full. It will also help you be more mindful of what and how much you are eating. What are tips for following this plan? Reading food labels Check the calorie count compared with the serving size. The serving size may be smaller than what you are used to eating. Check the source of the calories. Try to choose foods that are high in protein, fiber, and vitamins, and low in saturated fat, trans fat, and sodium. Shopping Read nutrition labels while you shop. This will help you make healthy decisions about which foods to buy. Pay attention to nutrition labels for low-fat or fat-free foods. These foods sometimes have the same number of calories or more calories than the full-fat versions. They also often have added sugar, starch, or salt to make up for flavor that was removed with the fat. Make a grocery list of lower-calorie foods and stick to it. Cooking Try to cook your favorite foods in a healthier way. For example, try baking instead of frying. Use low-fat dairy products. Meal planning Use more fruits and vegetables. One-half of your plate should be fruits and vegetables. Include lean proteins, such as chicken, turkey, and fish. Lifestyle Each week, aim to do one of the following: 150 minutes of moderate exercise, such as walking. 75 minutes of vigorous exercise, such as running. General information Know how many calories are in the foods you eat most often. This will help you calculate calorie counts faster. Find a way of tracking calories that works for you. Get creative. Try different apps or programs if writing down calories does not work for you. What foods should I eat?  Eat nutritious foods. It is better to have a nutritious, high-calorie food, such as an avocado, than a food with  few nutrients, such as a bag of potato chips. Use your calories on foods and drinks that will fill you up and will not leave you hungry soon after eating. Examples of foods that fill you up are nuts and nut butters, vegetables, lean proteins, and high-fiber foods such as whole grains. High-fiber foods are foods with more than 5 g of fiber per serving. Pay attention to calories in drinks. Low-calorie drinks include water and unsweetened drinks. The items listed above may not be a complete list of foods and beverages you can eat. Contact a dietitian for more information. What foods should I limit? Limit foods or drinks that are not good sources of vitamins, minerals, or protein or that are high in unhealthy fats. These include: Candy. Other sweets. Sodas, specialty   coffee drinks, alcohol, and juice. The items listed above may not be a complete list of foods and beverages you should avoid. Contact a dietitian for more information. How do I count calories when eating out? Pay attention to portions. Often, portions are much larger when eating out. Try these tips to keep portions smaller: Consider sharing a meal instead of getting your own. If you get your own meal, eat only half of it. Before you start eating, ask for a container and put half of your meal into it. When available, consider ordering smaller portions from the menu instead of full portions. Pay attention to your food and drink choices. Knowing the way food is cooked and what is included with the meal can help you eat fewer calories. If calories are listed on the menu, choose the lower-calorie options. Choose dishes that include vegetables, fruits, whole grains, low-fat dairy products, and lean proteins. Choose items that are boiled, broiled, grilled, or steamed. Avoid items that are buttered, battered, fried, or served with cream sauce. Items labeled as crispy are usually fried, unless stated otherwise. Choose water, low-fat milk,  unsweetened iced tea, or other drinks without added sugar. If you want an alcoholic beverage, choose a lower-calorie option, such as a glass of wine or light beer. Ask for dressings, sauces, and syrups on the side. These are usually high in calories, so you should limit the amount you eat. If you want a salad, choose a garden salad and ask for grilled meats. Avoid extra toppings such as bacon, cheese, or fried items. Ask for the dressing on the side, or ask for olive oil and vinegar or lemon to use as dressing. Estimate how many servings of a food you are given. Knowing serving sizes will help you be aware of how much food you are eating at restaurants. Where to find more information Centers for Disease Control and Prevention: FootballExhibition.com.br U.[place]. Department of Agriculture: WrestlingReporter.dk Summary Calorie counting means keeping track of how many calories you eat and drink each day. If you eat fewer calories than your body needs, you should lose weight. A healthy amount of weight to lose per week is usually 1-2 lb (0.5-0.9 kg). This usually means reducing your daily calorie intake by 500-750 calories. The number of calories in a food can be found on a Nutrition Facts label. If a food does not have a Nutrition Facts label, try to look up the calories online or ask your dietitian for help. Use smaller plates, glasses, and bowls for smaller portions and to prevent overeating. Use your calories on foods and drinks that will fill you up and not leave you hungry shortly after a meal. This information is not intended to replace advice given to you by your health care provider. Make sure you discuss any questions you have with your healthcare provider. Document Revised: 10/22/2019 Document Reviewed: 10/22/2019 Elsevier Patient Education  2022 Elsevier Inc. Acute Back Pain, Adult Acute back pain is sudden and usually short-lived. It is often caused by an injury to the muscles and tissues in the back. The injury may  result from: A muscle or ligament getting overstretched or torn (strained). Ligaments are tissues that connect bones to each other. Lifting something improperly can cause a back strain. Wear and tear (degeneration) of the spinal disks. Spinal disks are circular tissue that provide cushioning between the bones of the spine (vertebrae). Twisting motions, such as while playing sports or doing yard work. A hit to the back. Arthritis. You may  have a physical exam, lab tests, and imaging tests to find the cause ofyour pain. Acute back pain usually goes away with rest and home care. Follow these instructions at home: Managing pain, stiffness, and swelling Treatment may include medicines for pain and inflammation that are taken by mouth or applied to the skin, prescription pain medicine, or muscle relaxants. Take over-the-counter and prescription medicines only as told by your health care provider. Your health care provider may recommend applying ice during the first 24-48 hours after your pain starts. To do this: Put ice in a plastic bag. Place a towel between your skin and the bag. Leave the ice on for 20 minutes, 2-3 times a day. If directed, apply heat to the affected area as often as told by your health care provider. Use the heat source that your health care provider recommends, such as a moist heat pack or a heating pad. Place a towel between your skin and the heat source. Leave the heat on for 20-30 minutes. Remove the heat if your skin turns bright red. This is especially important if you are unable to feel pain, heat, or cold. You have a greater risk of getting burned. Activity  Do not stay in bed. Staying in bed for more than 1-2 days can delay your recovery. Sit up and stand up straight. Avoid leaning forward when you sit or hunching over when you stand. If you work at a desk, sit close to it so you do not need to lean over. Keep your chin tucked in. Keep your neck drawn back, and keep your  elbows bent at a 90-degree angle (right angle). Sit high and close to the steering wheel when you drive. Add lower back (lumbar) support to your car seat, if needed. Take short walks on even surfaces as soon as you are able. Try to increase the length of time you walk each day. Do not sit, drive, or stand in one place for more than 30 minutes at a time. Sitting or standing for long periods of time can put stress on your back. Do not drive or use heavy machinery while taking prescription pain medicine. Use proper lifting techniques. When you bend and lift, use positions that put less stress on your back: [place] your knees. Keep the load close to your body. Avoid twisting. Exercise regularly as told by your health care provider. Exercising helps your back heal faster and helps prevent back injuries by keeping muscles strong and flexible. Work with a physical therapist to make a safe exercise program, as recommended by your health care provider. Do any exercises as told by your physical therapist.  Lifestyle Maintain a healthy weight. Extra weight puts stress on your back and makes it difficult to have good posture. Avoid activities or situations that make you feel anxious or stressed. Stress and anxiety increase muscle tension and can make back pain worse. Learn ways to manage anxiety and stress, such as through exercise. General instructions Sleep on a firm mattress in a comfortable position. Try lying on your side with your knees slightly bent. If you lie on your back, put a pillow under your knees. Follow your treatment plan as told by your health care provider. This may include: Cognitive or behavioral therapy. Acupuncture or massage therapy. Meditation or yoga. Contact a health care provider if: You have pain that is not relieved with rest or medicine. You have increasing pain going down into your legs or buttocks. Your pain does not improve after 2  weeks. You have pain at night. You lose  weight without trying. You have a fever or chills. Get help right away if: You develop new bowel or bladder control problems. You have unusual weakness or numbness in your arms or legs. You develop nausea or vomiting. You develop abdominal pain. You feel faint. Summary Acute back pain is sudden and usually short-lived. Use proper lifting techniques. When you bend and lift, use positions that put less stress on your back. Take over-the-counter and prescription medicines and apply heat or ice as directed by your health care provider. This information is not intended to replace advice given to you by your health care provider. Make sure you discuss any questions you have with your healthcare provider. Document Revised: 05/31/2020 Document Reviewed: 06/03/2020 Elsevier Patient Education  2022 [employer].
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
## 2021-05-23 ENCOUNTER — Other Ambulatory Visit (INDEPENDENT_AMBULATORY_CARE_PROVIDER_SITE_OTHER): Payer: Self-pay | Admitting: Primary Care

## 2021-05-23 DIAGNOSIS — E039 Hypothyroidism, unspecified: Secondary | ICD-10-CM

## 2021-05-23 LAB — TSH+FREE T4
Free T4: 0.47 ng/dL — ABNORMAL LOW (ref 0.82–1.77)
TSH: 7.03 u[IU]/mL — ABNORMAL HIGH (ref 0.450–4.500)

## 2021-05-23 MED ORDER — LEVOTHYROXINE SODIUM 150 MCG PO TABS: ORAL_TABLET | ORAL | 1 refills | Status: DC

## 2021-05-30 ENCOUNTER — Encounter (HOSPITAL_BASED_OUTPATIENT_CLINIC_OR_DEPARTMENT_OTHER): Payer: Self-pay | Admitting: Orthopaedic Surgery

## 2021-05-30 ENCOUNTER — Ambulatory Visit (HOSPITAL_BASED_OUTPATIENT_CLINIC_OR_DEPARTMENT_OTHER): Payer: Self-pay | Admitting: Orthopaedic Surgery

## 2021-05-30 ENCOUNTER — Other Ambulatory Visit: Payer: Self-pay

## 2021-05-30 VITALS — BP 129/80 | Ht 61.0 in | Wt 167.6 lb

## 2021-05-30 DIAGNOSIS — S39012A Strain of muscle, fascia and tendon of lower back, initial encounter: Secondary | ICD-10-CM

## 2021-05-30 MED ORDER — MELOXICAM 15 MG PO TABS: 15.0000 mg | ORAL_TABLET | Freq: Every day | ORAL | 0 refills | Status: DC
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
## 2021-06-13 ENCOUNTER — Encounter (INDEPENDENT_AMBULATORY_CARE_PROVIDER_SITE_OTHER): Payer: Self-pay | Admitting: Primary Care

## 2021-06-13 ENCOUNTER — Other Ambulatory Visit (HOSPITAL_COMMUNITY)
Admission: RE | Admit: 2021-06-13 | Discharge: 2021-06-13 | Disposition: A | Discharge status: Home / Self Care | Payer: Self-pay | Source: Ambulatory Visit | Attending: Primary Care | Admitting: Primary Care

## 2021-06-13 ENCOUNTER — Other Ambulatory Visit: Payer: Self-pay

## 2021-06-13 ENCOUNTER — Ambulatory Visit (INDEPENDENT_AMBULATORY_CARE_PROVIDER_SITE_OTHER): Payer: Self-pay | Admitting: Primary Care

## 2021-06-13 VITALS — BP 115/81 | HR 74 | Temp 97.5°F | Ht 61.0 in | Wt 168.2 lb

## 2021-06-13 DIAGNOSIS — Z124 Encounter for screening for malignant neoplasm of cervix: Secondary | ICD-10-CM | POA: Insufficient documentation

## 2021-06-13 DIAGNOSIS — N898 Other specified noninflammatory disorders of vagina: Secondary | ICD-10-CM

## 2021-06-13 NOTE — Patient Instructions (Signed)
Pap Test °Why am I having this test? °A Pap test, also called a Pap smear, is a screening test to check for signs of: °Infection. °Cancer of the cervix. The cervix is the lower part of the uterus that opens into the vagina. °Changes that may be a sign that cancer is developing (precancerous changes). °Women need this test on a regular basis. In general, you should have a Pap test every 3 years until you reach menopause or age 46. Women aged 30-60 may choose to have their Pap test done at the same time as an HPV (human papillomavirus) test every 5 years (instead of every 3 years). °Your health care provider may recommend having Pap tests more or less often depending on your medical conditions and past Pap test results. °What is being tested? °Cervical cells are tested for signs of infection or abnormalities. °What kind of sample is taken? °Your health care provider will collect a sample of cells from the surface of your cervix. This will be done using a small cotton swab, plastic spatula, or brush that is inserted into your vagina using a tool called a speculum. This sample is often collected during a pelvic exam, when you are lying on your back on an exam table with your feet in footrests (stirrups). In some cases, fluids (secretions) from the cervix or vagina may also be collected. °How do I prepare for this test? °Be aware of where you are in your menstrual cycle. If you are menstruating on the day of the test, you may be asked to reschedule. °You may need to reschedule if you have a known vaginal infection on the day of the test. °Follow instructions from your health care provider about: °Changing or stopping your regular medicines. Some medicines can cause abnormal test results, such as vaginal medicines and tetracycline. °Avoiding douching 2-3 days before or the day of the test. °Tell a health care provider about: °Any allergies you have. °All medicines you are taking, including vitamins, herbs, eye drops,  creams, and over-the-counter medicines. °Any bleeding problems you have. °Any surgeries you have had. °Any medical conditions you have. °Whether you are pregnant or may be pregnant. °How are the results reported? °Your test results will be reported as either abnormal or normal. °What do the results mean? °A normal test result means that you do not have signs of cancer of the cervix. °An abnormal result may mean that you have: °Cancer. A Pap test by itself is not enough to diagnose cancer. You will have more tests done if cancer is suspected. °Precancerous changes in your cervix. °Inflammation of the cervix. °An STI (sexually transmitted infection). °A fungal infection. °A parasite infection. °Talk with your health care provider about what your results mean. In some cases, your health care provider may do more testing to confirm the results. °Questions to ask your health care provider °Ask your health care provider, or the department that is doing the test: °When will my results be ready? °How will I get my results? °What are my treatment options? °What other tests do I need? °What are my next steps? °Summary °In general, women should have a Pap test every 3 years until they reach menopause or age 46. °Your health care provider will collect a sample of cells from the surface of your cervix. This will be done using a small cotton swab, plastic spatula, or brush. °In some cases, fluids (secretions) from the cervix or vagina may also be collected. °This information is not intended   to replace advice given to you by your health care provider. Make sure you discuss any questions you have with your health care provider. °Document Revised: 12/09/2020 Document Reviewed: 12/09/2020 °Elsevier Patient Education © 2022 [employer] ° °

## 2021-06-13 NOTE — Progress Notes (Signed)
[facility]  WELL-WOMAN PHYSICAL [facility]AP Patient name: Meghan Galvan MRN [id number]  Date of birth: 1975/12/01 Chief Complaint:   Gynecologic Exam  History of Present Illness:   Meghan Galvan is a 45 y.o. G1P0 female being seen today for a routine well-woman exam.   BW:GYKZLDJ discharge   The current method of family planning is none.  Patient's last menstrual period was 05/25/2021 (approximate).  Last pap   Subjective:  Patient ID: Meghan Galvan, female    DOB: [birth year]  Age: 45 y.o. MRN: [id number]  CC:  Chief Complaint  Patient presents with   Gynecologic Exam    HPI [name] presents for gyn exam and vaginal discharge.  Past Medical History:  Diagnosis Date   Thyroid disease    Hypothyroidism    Past Surgical History:  Procedure Laterality Date   THYROIDECTOMY  March 2016    No family history on file.  Social History   Socioeconomic History   Marital status: Single    Spouse name: Not on file   Number of children: Not on file   Years of education: Not on file   Highest education level: Not on file  Occupational History   Not on file  Tobacco Use   Smoking status: Some Days    Packs/day: 0.25    Types: Cigarettes   Smokeless tobacco: Never  Vaping Use   Vaping Use: Never used  Substance and Sexual Activity   Alcohol use: Yes    Alcohol/week: 4.0 standard drinks    Types: 4 Glasses of wine per week    Comment: occasional   Drug use: No   Sexual activity: Yes  Other Topics Concern   Not on file  Social History Narrative   Not on file   Social Determinants of Health   Financial Resource Strain: Not on file  Food Insecurity: Not on file  Transportation Needs: Not on file  Physical Activity: Not on file  Stress: Not on file  Social Connections: Not on file  Intimate Partner Violence: Not on file    ROS Denies any headaches, blurred vision, fatigue, shortness of breath, chest pain, abdominal pain, abnormal  vaginal discharge/itching/odor/irritation, problems with periods, bowel movements, urination, or intercourse unless otherwise stated above.  Objective:   Today's Vitals: BP 115/81 (BP Location: Right Arm, Patient Position: Sitting, Cuff Size: Normal)   Pulse 74   Temp (!) 97.5 F (36.4 C) (Temporal)   Ht 5\' 1"  (1.549 m)   Wt 168 lb 3.2 oz (76.3 kg)   LMP 05/25/2021 (Approximate)   SpO2 97%   BMI 31.78 kg/m        Physical Examination:  General appearance - well appearing, and in no distress Mental status - alert, oriented to person, place, and time Psych:  She has a normal mood and affect Skin - warm and dry, normal color, no suspicious lesions noted Chest - effort normal, all lung fields clear to auscultation bilaterally Heart - normal rate and regular rhythm Neck:  midline trachea, no thyromegaly or nodules Breasts - breasts appear normal, no suspicious masses, no skin or nipple changes or axillary nodes Educated patient on proper self breast examination and had patient to demonstrate SBE. Abdomen - soft, nontender, nondistended, no masses or organomegaly Pelvic-VULVA: normal appearing vulva with no masses, tenderness or lesions   VAGINA: normal appearing vagina with normal color and discharge, no lesions   CERVIX: normal appearing cervix without discharge or lesions, no CMT UTERUS: uterus is felt to  be normal size, shape, consistency and nontender  ADNEXA: No adnexal masses or tenderness noted. Extremities:  No swelling or varicosities noted   Outpatient Encounter Medications as of 06/13/2021  Medication Sig   levothyroxine (SYNTHROID) 150 MCG tablet Please take it on an empty stomach 68mins-1 hour before food to have the most effective absorption. I will be sending in levothyroxine 150   Mcg. Recheck lab 6 weeks if needed   ibuprofen (ADVIL) 600 MG tablet Take 1 tablet (600 mg total) by mouth every 8 (eight) hours as needed. (Patient not taking: Reported on 06/13/2021)    meloxicam (MOBIC) 15 MG tablet Take 1 tablet (15 mg total) by mouth daily. (Patient not taking: Reported on 06/13/2021)   No facility-administered encounter medications on file as of 06/13/2021.    Follow-up: Return if symptoms worsen or fail to improve.   Grayce Sessions, NP This note has been created with Education officer, environmental. Any transcriptional errors are unintentional.   Grayce Sessions, NP 06/13/2021, 1:47 PM

## 2021-06-15 LAB — CERVICOVAGINAL ANCILLARY ONLY
Bacterial Vaginitis (gardnerella): POSITIVE — AB
Candida Glabrata: NEGATIVE
Candida Vaginitis: NEGATIVE
Chlamydia: NEGATIVE
Comment: NEGATIVE
Comment: NEGATIVE
Comment: NEGATIVE
Comment: NEGATIVE
Comment: NEGATIVE
Comment: NORMAL
Neisseria Gonorrhea: NEGATIVE
Trichomonas: NEGATIVE

## 2021-06-17 ENCOUNTER — Other Ambulatory Visit (INDEPENDENT_AMBULATORY_CARE_PROVIDER_SITE_OTHER): Payer: Self-pay | Admitting: Primary Care

## 2021-06-17 DIAGNOSIS — B9689 Other specified bacterial agents as the cause of diseases classified elsewhere: Secondary | ICD-10-CM

## 2021-06-17 DIAGNOSIS — N760 Acute vaginitis: Secondary | ICD-10-CM

## 2021-06-17 MED ORDER — METRONIDAZOLE 500 MG PO TABS: 500.0000 mg | ORAL_TABLET | Freq: Two times a day (BID) | ORAL | 0 refills | Status: DC

## 2021-06-20 LAB — CYTOLOGY - PAP
Comment: NEGATIVE
Diagnosis: NEGATIVE
High risk HPV: NEGATIVE

## 2021-06-27 ENCOUNTER — Other Ambulatory Visit (HOSPITAL_BASED_OUTPATIENT_CLINIC_OR_DEPARTMENT_OTHER): Payer: Self-pay | Admitting: Orthopaedic Surgery

## 2021-06-28 ENCOUNTER — Other Ambulatory Visit: Payer: Self-pay | Admitting: Nurse Practitioner

## 2021-06-28 ENCOUNTER — Other Ambulatory Visit (INDEPENDENT_AMBULATORY_CARE_PROVIDER_SITE_OTHER): Payer: Self-pay | Admitting: Primary Care

## 2021-06-28 ENCOUNTER — Other Ambulatory Visit (INDEPENDENT_AMBULATORY_CARE_PROVIDER_SITE_OTHER): Payer: Self-pay | Admitting: Nurse Practitioner

## 2021-06-28 DIAGNOSIS — E039 Hypothyroidism, unspecified: Secondary | ICD-10-CM

## 2021-06-28 NOTE — Telephone Encounter (Signed)
NEEDS LABS for TSH T4

## 2021-07-04 ENCOUNTER — Ambulatory Visit (HOSPITAL_BASED_OUTPATIENT_CLINIC_OR_DEPARTMENT_OTHER): Payer: No Typology Code available for payment source | Attending: Orthopaedic Surgery | Admitting: Physical Therapy

## 2021-07-25 ENCOUNTER — Other Ambulatory Visit (INDEPENDENT_AMBULATORY_CARE_PROVIDER_SITE_OTHER): Payer: Self-pay

## 2021-09-23 ENCOUNTER — Other Ambulatory Visit (INDEPENDENT_AMBULATORY_CARE_PROVIDER_SITE_OTHER): Payer: Self-pay | Admitting: Primary Care

## 2021-09-23 DIAGNOSIS — E039 Hypothyroidism, unspecified: Secondary | ICD-10-CM

## 2021-09-23 NOTE — Telephone Encounter (Signed)
Requested medication (s) are due for refill today: yes  Requested medication (s) are on the active medication list: yes  Last refill:  08/26/21  Future visit scheduled: no, NO SHOW 07/25/21, was to repeat labs in 6 weeks and did not  Notes to clinic:  Has already had a curtesy refill and there is no upcoming appointment scheduled.   Requested Prescriptions  Pending Prescriptions Disp Refills   levothyroxine (SYNTHROID) 150 MCG tablet [Pharmacy Med Name: LEVOTHYROXINE 0.150MG  ( ) TAB] 30 tablet 0    Sig: TAKE 1 TABLET EVERY DAY ON EMPTY STOMACH 30 MINS-1 HR BEFORE FOOD FOR BEST ABSORPTION. RECHECK LAB IN 6 WEEKS AS NEEDED     Endocrinology:  Hypothyroid Agents Failed - 09/23/2021  3:08 AM      Failed - TSH needs to be rechecked within 3 months after an abnormal result. Refill until TSH is due.      Failed - TSH in normal range and within 360 days    TSH  Date Value Ref Range Status  05/22/2021 7.030 (H) 0.450 - 4.500 uIU/mL Final          Passed - Valid encounter within last 12 months    Recent Outpatient Visits           3 months ago Cervical cancer screening   [facility] Grayce Sessions, NP   4 months ago Hypothyroidism, unspecified type   [facility] Grayce Sessions, NP   1 year ago Hypothyroidism, unspecified type   [facility] Grayce Sessions, NP   2 years ago Hypothyroidism, unspecified type   [facility] Grayce Sessions, NP   3 years ago Hypothyroidism, unspecified type   [facility] Loletta Specter, PA-C

## 2021-09-26 ENCOUNTER — Other Ambulatory Visit (INDEPENDENT_AMBULATORY_CARE_PROVIDER_SITE_OTHER): Payer: Self-pay | Admitting: Primary Care

## 2021-09-26 DIAGNOSIS — E039 Hypothyroidism, unspecified: Secondary | ICD-10-CM

## 2021-10-10 ENCOUNTER — Other Ambulatory Visit (INDEPENDENT_AMBULATORY_CARE_PROVIDER_SITE_OTHER): Payer: Self-pay

## 2021-11-06 ENCOUNTER — Other Ambulatory Visit (INDEPENDENT_AMBULATORY_CARE_PROVIDER_SITE_OTHER): Payer: Self-pay | Admitting: Primary Care

## 2021-11-06 DIAGNOSIS — E039 Hypothyroidism, unspecified: Secondary | ICD-10-CM

## 2021-11-07 NOTE — Telephone Encounter (Signed)
Failed protocol of TSH in normal range, it is within 360 days, 05/22/21. Pt also has already been given a curtesy refill, was to come back and repeat labs in 6 weeks, mid Oct., no upcoming visit scheduled. Requested Prescriptions  Pending Prescriptions Disp Refills   levothyroxine (SYNTHROID) 150 MCG tablet [Pharmacy Med Name: LEVOTHYROXINE 0.150MG  ( ) TAB] 30 tablet 0    Sig: TAKE 1 TABLET BY MOUTH EVERY DAY ON EMPTY STOMACH 30 MIN-1 HOUR BEFORE FOOD FOR BEST ABSORPTION. RECHECK LAB IN 6 WEEKS AS NEEDED     Endocrinology:  Hypothyroid Agents Failed - 11/06/2021  8:32 AM      Failed - TSH in normal range and within 360 days    TSH  Date Value Ref Range Status  05/22/2021 7.030 (H) 0.450 - 4.500 uIU/mL Final         Passed - Valid encounter within last 12 months    Recent Outpatient Visits          4 months ago Cervical cancer screening   [facility] Grayce Sessions, NP   5 months ago Hypothyroidism, unspecified type   [facility] Grayce Sessions, NP   1 year ago Hypothyroidism, unspecified type   [facility] Grayce Sessions, NP   2 years ago Hypothyroidism, unspecified type   [facility] Grayce Sessions, NP   3 years ago Hypothyroidism, unspecified type   [facility] Loletta Specter, PA-C

## 2021-11-22 ENCOUNTER — Other Ambulatory Visit (INDEPENDENT_AMBULATORY_CARE_PROVIDER_SITE_OTHER): Payer: Self-pay | Admitting: Primary Care

## 2021-11-22 DIAGNOSIS — E039 Hypothyroidism, unspecified: Secondary | ICD-10-CM

## 2021-11-22 NOTE — Telephone Encounter (Signed)
Sent to PCP ?

## 2022-01-03 ENCOUNTER — Other Ambulatory Visit (INDEPENDENT_AMBULATORY_CARE_PROVIDER_SITE_OTHER): Payer: Self-pay | Admitting: Primary Care

## 2022-01-03 ENCOUNTER — Encounter (INDEPENDENT_AMBULATORY_CARE_PROVIDER_SITE_OTHER): Payer: Self-pay | Admitting: Primary Care

## 2022-01-03 ENCOUNTER — Ambulatory Visit (INDEPENDENT_AMBULATORY_CARE_PROVIDER_SITE_OTHER): Payer: Self-pay | Admitting: Primary Care

## 2022-01-03 VITALS — BP 115/76 | HR 83 | Temp 98.0°F | Ht 61.0 in | Wt 183.8 lb

## 2022-01-03 DIAGNOSIS — E039 Hypothyroidism, unspecified: Secondary | ICD-10-CM

## 2022-01-03 NOTE — Progress Notes (Signed)
? ?   [facility] ? ? ?Subjective:  ?  ? Meghan Galvan is a 46 y.o. female who presents for follow up of hypothyroidism. Current symptoms: change in energy level, heat / cold intolerance, and weight changes. Patient denies diarrhea, nervousness, and palpitations. Symptoms have been basically asymptomatic. ? ?The following portions of the patient's history were reviewed and updated as appropriate: allergies, current medications, past family history, past medical history, past social history, and past surgical history. ? ?Review of Systems ?Pertinent items noted in HPI and remainder of comprehensive ROS otherwise negative.  ?  ?Objective:  ?BP 115/76 (BP Location: Left Arm, Patient Position: Sitting, Cuff Size: Normal)   Pulse 83   Temp 98 ?F (36.7 ?C) (Oral)   Ht 5\' 1"  (1.549 m)   Wt 183 lb 12.8 oz (83.4 kg)   LMP 12/27/2021   SpO2 98%   Breastfeeding No   BMI 34.73 kg/m?   ? BP 115/76 (BP Location: Left Arm, Patient Position: Sitting, Cuff Size: Normal)   Pulse 83   Temp 98 ?F (36.7 ?C) (Oral)   Ht 5\' 1"  (1.549 m)   Wt 183 lb 12.8 oz (83.4 kg)   LMP 12/27/2021   SpO2 98%   Breastfeeding No   BMI 34.73 kg/m?  ?General appearance: alert, appears stated age, mild distress, and mildly obese ?Head: Normocephalic, without obvious abnormality, atraumatic ?Ears: normal TM's and external ear canals both ears ?Nose: Nares normal. Septum midline. Mucosa normal. No drainage or sinus tenderness. ?Throat: lips, mucosa, and tongue normal; teeth and gums normal ?Neck: no adenopathy, no carotid bruit, no JVD, supple, symmetrical, trachea midline, and thyroid not enlarged, symmetric, no tenderness/mass/nodules ?Lungs: clear to auscultation bilaterally ?Heart: regular rate and rhythm, S1, S2 normal, no murmur, click, rub or gallop ?Abdomen: soft, non-tender; bowel sounds normal; no masses,  no organomegaly ?Extremities: extremities normal, atraumatic, no cyanosis or edema ?Skin: Skin color,  texture, turgor normal. No rashes or lesions ?Lymph nodes: Cervical, supraclavicular, and axillary nodes normal. ?Neurologic: Alert and oriented X 3, normal strength and tone. Normal symmetric reflexes. Normal coordination and gait ? ?Laboratory: ?Lab Results  ?Component Value Date  ? TSH 7.030 (H) 05/22/2021  ?  ? ?  ?Assessment:  ? ? Hypothyroidism.  Replacement  on hold out of medication for several months. Refused to refill thyroid medication without labs.   ?  1. L-thyroxine per orders. ?2. Recheck thyroid function tests in 6 weeks. ?3. Instructed not to take multivitamins or iron within 4 hours of taking thyroid medications. ?4. Follow up in 6 weeks.  ? ?This note has been created with 02/26/2022. Any transcriptional errors are unintentional.  ? ?05/24/2021, NP ?01/03/2022, 1:41 PM  ?

## 2022-01-03 NOTE — Telephone Encounter (Signed)
Sent to PCP ?

## 2022-01-03 NOTE — Patient Instructions (Signed)

## 2022-01-04 ENCOUNTER — Other Ambulatory Visit (INDEPENDENT_AMBULATORY_CARE_PROVIDER_SITE_OTHER): Payer: Self-pay | Admitting: Primary Care

## 2022-01-04 DIAGNOSIS — E039 Hypothyroidism, unspecified: Secondary | ICD-10-CM

## 2022-01-04 LAB — TSH+FREE T4
Free T4: 0.25 ng/dL — ABNORMAL LOW (ref 0.82–1.77)
TSH: 71.9 u[IU]/mL — ABNORMAL HIGH (ref 0.450–4.500)

## 2022-01-04 MED ORDER — LEVOTHYROXINE SODIUM 100 MCG PO TABS: ORAL_TABLET | ORAL | 1 refills | Status: DC

## 2022-01-06 NOTE — Telephone Encounter (Signed)
Patient seen and labs drawn ?

## 2022-02-03 ENCOUNTER — Telehealth (INDEPENDENT_AMBULATORY_CARE_PROVIDER_SITE_OTHER): Payer: Self-pay | Admitting: Nurse Practitioner

## 2022-02-03 DIAGNOSIS — E039 Hypothyroidism, unspecified: Secondary | ICD-10-CM

## 2022-02-05 NOTE — Telephone Encounter (Signed)
Refused Synthroid 150 mcg because it was discontinued on 09/25/2021. Dose change. ?

## 2022-02-07 ENCOUNTER — Other Ambulatory Visit (INDEPENDENT_AMBULATORY_CARE_PROVIDER_SITE_OTHER): Payer: Self-pay | Admitting: Nurse Practitioner

## 2022-02-07 DIAGNOSIS — E039 Hypothyroidism, unspecified: Secondary | ICD-10-CM

## 2022-02-07 NOTE — Telephone Encounter (Signed)
Patient aware that CMA spoke with pharmacy. Medication was put back due to not being picked up. Pharmacy has filled and it is ready for pickup.  ?

## 2022-02-07 NOTE — Telephone Encounter (Signed)
Pt calling back and stated that she still can not get the refill. She would like a call back from the office. Pharmacy stated that she does not have an authorization for the refill. Please advise  ?

## 2022-02-07 NOTE — Telephone Encounter (Signed)
Pt is calling back, requesting a follow-up on a medication refill. Pt checked her MycHART while on the line with me and stated she was unaware the medication had been lowered to 100 mg. Pt stated she has been feeling tired, and she believes this is the reason why. ? ?Pt is requesting a call back to discuss medication dose and refill . Pt stated she took her last tablet this morning.  ? ?Pt has an appointment on May 24. ?

## 2022-02-07 NOTE — Telephone Encounter (Signed)
Spoke with patient and advised to contact the pharmacy for refills as the Rx has refills. Informed that medication dosage was changed due to thyroid levels. She verbalized understanding.  ?

## 2022-02-14 ENCOUNTER — Ambulatory Visit (INDEPENDENT_AMBULATORY_CARE_PROVIDER_SITE_OTHER): Payer: Self-pay | Admitting: Primary Care

## 2022-02-14 ENCOUNTER — Encounter (INDEPENDENT_AMBULATORY_CARE_PROVIDER_SITE_OTHER): Payer: Self-pay | Admitting: Primary Care

## 2022-02-14 VITALS — BP 104/67 | HR 59 | Temp 98.1°F | Ht 61.0 in | Wt 173.6 lb

## 2022-02-14 DIAGNOSIS — E039 Hypothyroidism, unspecified: Secondary | ICD-10-CM

## 2022-02-14 NOTE — Progress Notes (Signed)
Please make sure to send patient medication to [facility] in [place]

## 2022-02-14 NOTE — Progress Notes (Signed)
    [facility]       Subjective:     Meghan Galvan is a 46 y.o. female who presents for follow up of hypothyroidism. Current symptoms: none. Patient denies change in energy level, diarrhea, heat / cold intolerance, nervousness, and weight changes. Symptoms have gradually improved.  The following portions of the patient's history were reviewed and updated as appropriate: allergies, current medications, past family history, past medical history, past social history, past surgical history, and problem list.  Review of Systems Pertinent items noted in HPI and remainder of comprehensive ROS otherwise negative.    Objective:    BP 104/67   Pulse (!) 59   Temp 98.1 F (36.7 C) (Oral)   Ht 5\' 1"  (1.549 m)   Wt 173 lb 9.6 oz (78.7 kg)   LMP 02/06/2022 (Exact Date)   SpO2 99%   BMI 32.80 kg/m   Vitals:   02/14/22 1416  BP: 104/67  Pulse: (!) 59  Temp: 98.1 F (36.7 C)  TempSrc: Oral  SpO2: 99%  Weight: 173 lb 9.6 oz (78.7 kg)  Height: 5\' 1"  (1.549 m)   General: Vital signs reviewed.  Patient is well-developed and well-nourished female , in no acute distress and cooperative with exam.  Head: Normocephalic and atraumatic. Eyes: EOMI, conjunctivae normal, no scleral icterus.  Neck: Supple, trachea midline, normal ROM, no JVD, masses, thyromegaly, or carotid bruit present.  Cardiovascular: RRR, S1 normal, S2 normal, no murmurs, gallops, or rubs. Pulmonary/Chest: Clear to auscultation bilaterally, no wheezes, rales, or rhonchi. Abdominal: Soft, non-tender, non-distended, BS +, no masses, organomegaly, or guarding present.  Musculoskeletal: No joint deformities, erythema, or stiffness, ROM full and nontender. Extremities: No lower extremity edema bilaterally,  pulses symmetric and intact bilaterally. No cyanosis or clubbing. Neurological: A&O x3, Strength is normal and symmetric bilaterally,  no focal motor deficit, sensory intact to light touch bilaterally.   Skin: Warm, dry and intact. No rashes or erythema. Psychiatric: Normal mood and affect. speech and behavior is normal. Cognition and memory are normal.    Laboratory: Lab Results  Component Value Date   TSH 71.900 (H) 01/03/2022      Assessment:  Meghan Galvan was seen today for hypothyroidism.  Diagnoses and all orders for this visit:  Hypothyroidism, unspecified type  Hypothyroidism.  Replacement   -     TSH + free T4  1. L-thyroxine per orders. 2. Recheck thyroid function tests in 6 weeks. 3. Instructed not to take multivitamins or iron within 4 hours of taking thyroid medications. 4. Follow up in 6 weeks.   This note has been created with Surveyor, quantity. Any transcriptional errors are unintentional.   Kerin Perna, NP 02/14/2022, 2:32 PM

## 2022-02-15 ENCOUNTER — Other Ambulatory Visit (INDEPENDENT_AMBULATORY_CARE_PROVIDER_SITE_OTHER): Payer: Self-pay | Admitting: Primary Care

## 2022-02-15 DIAGNOSIS — E039 Hypothyroidism, unspecified: Secondary | ICD-10-CM

## 2022-02-15 LAB — TSH+FREE T4
Free T4: 1.38 ng/dL (ref 0.82–1.77)
TSH: 0.11 u[IU]/mL — ABNORMAL LOW (ref 0.450–4.500)

## 2022-02-15 MED ORDER — LEVOTHYROXINE SODIUM 100 MCG PO TABS: ORAL_TABLET | ORAL | 1 refills | Status: DC

## 2022-04-16 ENCOUNTER — Encounter (INDEPENDENT_AMBULATORY_CARE_PROVIDER_SITE_OTHER): Payer: Self-pay | Admitting: Primary Care

## 2022-04-16 ENCOUNTER — Ambulatory Visit (INDEPENDENT_AMBULATORY_CARE_PROVIDER_SITE_OTHER): Payer: Self-pay | Admitting: Primary Care

## 2022-04-16 VITALS — BP 113/75 | HR 79 | Temp 98.5°F | Ht 61.0 in | Wt 160.6 lb

## 2022-04-16 DIAGNOSIS — E039 Hypothyroidism, unspecified: Secondary | ICD-10-CM

## 2022-04-16 NOTE — Progress Notes (Signed)
     [facility]          Subjective:     Meghan Galvan is a 46 y.o. female who presents for follow up of hypothyroidism. Current symptoms: weight changes. Patient denies diarrhea, nervousness, and palpitations. Symptoms have been well-controlled.  The following portions of the patient's history were reviewed and updated as appropriate: allergies, current medications, past family history, past medical history, past social history, and past surgical history.  Review of Systems Pertinent items noted in HPI and remainder of comprehensive ROS otherwise negative.    Objective:    BP 113/75   Pulse 79   Temp 98.5 F (36.9 C) (Oral)   Ht 5\' 1"  (1.549 m)   Wt 160 lb 9.6 oz (72.8 kg)   LMP 03/27/2022   SpO2 95%   BMI 30.35 kg/m  General appearance: alert, appears stated age, no distress, and mildly obese Head: Normocephalic, without obvious abnormality, atraumatic Eyes: conjunctivae/corneas clear. PERRL, EOM's intact. Fundi benign. Ears: normal TM's and external ear canals both ears Nose: Nares normal. Septum midline. Mucosa normal. No drainage or sinus tenderness. Neck: no adenopathy, no carotid bruit, no JVD, supple, symmetrical, trachea midline, and thyroid not enlarged, symmetric, no tenderness/mass/nodules Back: symmetric, no curvature. ROM normal. No CVA tenderness. Lungs: clear to auscultation bilaterally Heart: regular rate and rhythm, S1, S2 normal, no murmur, click, rub or gallop Abdomen: soft, non-tender; bowel sounds normal; no masses,  no organomegaly Extremities: extremities normal, atraumatic, no cyanosis or edema Pulses: 2+ and symmetric Skin: Skin color, texture, turgor normal. No rashes or lesions Lymph nodes: Cervical, supraclavicular, and axillary nodes normal. Neurologic: Alert and oriented X 3, normal strength and tone. Normal symmetric reflexes. Normal coordination and gait  Laboratory: Lab Results  Component Value Date   TSH 0.110  (L) 02/14/2022      Assessment:    Hypothyroidism.  Replacement 02/16/2022   Plan:    1. L-thyroxine per orders. 2. Recheck thyroid function tests in 2 month. 3. Instructed not to take multivitamins or iron within 4 hours of taking thyroid medications. 4. Follow up in 2 month.   This note has been created with . Any transcriptional errors are unintentional.   Education officer, environmental, NP 04/16/2022, 3:37 PM

## 2022-04-17 ENCOUNTER — Other Ambulatory Visit (INDEPENDENT_AMBULATORY_CARE_PROVIDER_SITE_OTHER): Payer: Self-pay | Admitting: Primary Care

## 2022-04-17 DIAGNOSIS — E039 Hypothyroidism, unspecified: Secondary | ICD-10-CM

## 2022-04-17 LAB — TSH+FREE T4
Free T4: 1.19 ng/dL (ref 0.82–1.77)
TSH: 2.96 u[IU]/mL (ref 0.450–4.500)

## 2022-04-17 MED ORDER — LEVOTHYROXINE SODIUM 100 MCG PO TABS: ORAL_TABLET | ORAL | 1 refills | Status: DC

## 2022-06-18 ENCOUNTER — Other Ambulatory Visit (INDEPENDENT_AMBULATORY_CARE_PROVIDER_SITE_OTHER): Payer: Self-pay

## 2022-06-18 DIAGNOSIS — E039 Hypothyroidism, unspecified: Secondary | ICD-10-CM

## 2022-06-19 LAB — TSH+FREE T4
Free T4: 1.14 ng/dL (ref 0.82–1.77)
TSH: 3.43 u[IU]/mL (ref 0.450–4.500)

## 2022-06-21 ENCOUNTER — Other Ambulatory Visit (INDEPENDENT_AMBULATORY_CARE_PROVIDER_SITE_OTHER): Payer: Self-pay | Admitting: Primary Care

## 2022-06-21 DIAGNOSIS — E039 Hypothyroidism, unspecified: Secondary | ICD-10-CM

## 2022-06-21 MED ORDER — LEVOTHYROXINE SODIUM 100 MCG PO TABS: ORAL_TABLET | ORAL | 1 refills | Status: DC

## 2022-07-30 ENCOUNTER — Telehealth: Payer: Self-pay | Admitting: Physician Assistant

## 2022-07-30 ENCOUNTER — Ambulatory Visit (INDEPENDENT_AMBULATORY_CARE_PROVIDER_SITE_OTHER): Payer: Self-pay | Admitting: *Deleted

## 2022-07-30 ENCOUNTER — Encounter: Payer: No Typology Code available for payment source | Admitting: Physician Assistant

## 2022-07-30 DIAGNOSIS — B9689 Other specified bacterial agents as the cause of diseases classified elsewhere: Secondary | ICD-10-CM

## 2022-07-30 DIAGNOSIS — J208 Acute bronchitis due to other specified organisms: Secondary | ICD-10-CM

## 2022-07-30 MED ORDER — PROMETHAZINE-DM 6.25-15 MG/5ML PO SYRP: 5.0000 mL | ORAL_SOLUTION | Freq: Four times a day (QID) | ORAL | 0 refills | Status: DC | PRN

## 2022-07-30 MED ORDER — AZITHROMYCIN 250 MG PO TABS: ORAL_TABLET | ORAL | 0 refills | Status: AC

## 2022-07-30 NOTE — Telephone Encounter (Signed)
Noted  

## 2022-07-30 NOTE — Patient Instructions (Signed)
Meghan Galvan, thank you for joining Meghan Loveless, PA-C for today's virtual visit.  While this provider is not your primary care provider (PCP), if your PCP is located in our provider database this encounter information will be shared with them immediately following your visit.   A [facility] MyChart account gives you access to today's visit and all your visits, tests, and labs performed at [facility] " click here if you don't have a [facility] MyChart account or go to [email or web address]  Consent: (Patient) Meghan Galvan provided verbal consent for this virtual visit at the beginning of the encounter.  Current Medications:  Current Outpatient Medications:    azithromycin (ZITHROMAX) 250 MG tablet, Take 2 tablets on day 1, then 1 tablet daily on days 2 through 5, Disp: 6 tablet, Rfl: 0   promethazine-dextromethorphan (PROMETHAZINE-DM) 6.25-15 MG/5ML syrup, Take 5 mLs by mouth 4 (four) times daily as needed., Disp: 118 mL, Rfl: 0   levothyroxine (SYNTHROID) 100 MCG tablet, TAKE 1 TABLET EVERY DAY ON EMPTY STOMACH 30 MINS-1 HR BEFORE FOOD FOR BEST ABSORPTION. RECHECK LAB IN 6 WEEKS AS NEEDED, Disp: 90 tablet, Rfl: 1   Medications ordered in this encounter:  Meds ordered this encounter  Medications   azithromycin (ZITHROMAX) 250 MG tablet    Sig: Take 2 tablets on day 1, then 1 tablet daily on days 2 through 5    Dispense:  6 tablet    Refill:  0    Order Specific Question:   Supervising Provider    Answer:   Merrilee Jansky [[id number]]   promethazine-dextromethorphan (PROMETHAZINE-DM) 6.25-15 MG/5ML syrup    Sig: Take 5 mLs by mouth 4 (four) times daily as needed.    Dispense:  118 mL    Refill:  0    Order Specific Question:   Supervising Provider    Answer:   Merrilee Jansky [[id number]]     *If you need refills on other medications prior to your next appointment, please contact your pharmacy*  Follow-Up: Call back or seek an in-person evaluation  if the symptoms worsen or if the condition fails to improve as anticipated.  [facility] [phone]  Other Instructions Acute Bronchitis, Adult  Acute bronchitis is sudden inflammation of the main airways (bronchi) that come off the windpipe (trachea) in the lungs. The swelling causes the airways to get smaller and make more mucus than normal. This can make it hard to breathe and can cause coughing or noisy breathing (wheezing). Acute bronchitis may last several weeks. The cough may last longer. Allergies, asthma, and exposure to smoke may make the condition worse. What are the causes? This condition can be caused by germs and by substances that irritate the lungs, including: Cold and flu viruses. The most common cause of this condition is the virus that causes the common cold. Bacteria. This is less common. Breathing in substances that irritate the lungs, including: Smoke from cigarettes and other forms of tobacco. Dust and pollen. Fumes from household cleaning products, gases, or burned fuel. Indoor or outdoor air pollution. What increases the risk? The following factors may make you more likely to develop this condition: A weak body's defense system, also called the immune system. A condition that affects your lungs and breathing, such as asthma. What are the signs or symptoms? Common symptoms of this condition include: Coughing. This may bring up clear, yellow, or green mucus from your lungs (sputum). Wheezing. Runny or stuffy nose. Having too much mucus  in your lungs (chest congestion). Shortness of breath. Aches and pains, including sore throat or chest. How is this diagnosed? This condition is usually diagnosed based on: Your symptoms and medical history. A physical exam. You may also have other tests, including tests to rule out other conditions, such as pneumonia. These tests include: A test of lung function. Test of a mucus sample to look for the  presence of bacteria. Tests to check the oxygen level in your blood. Blood tests. Chest X-ray. How is this treated? Most cases of acute bronchitis clear up over time without treatment. Your health care provider may recommend: Drinking more fluids to help thin your mucus so it is easier to cough up. Taking inhaled medicine (inhaler) to improve air flow in and out of your lungs. Using a vaporizer or a humidifier. These are machines that add water to the air to help you breathe better. Taking a medicine that thins mucus and clears congestion (expectorant). Taking a medicine that prevents or stops coughing (cough suppressant). It is not common to take an antibiotic medicine for this condition. Follow these instructions at home:  Take over-the-counter and prescription medicines only as told by your health care provider. Use an inhaler, vaporizer, or humidifier as told by your health care provider. Take two teaspoons (10 mL) of honey at bedtime to lessen coughing at night. Drink enough fluid to keep your urine pale yellow. Do not use any products that contain nicotine or tobacco. These products include cigarettes, chewing tobacco, and vaping devices, such as e-cigarettes. If you need help quitting, ask your health care provider. Get plenty of rest. Return to your normal activities as told by your health care provider. Ask your health care provider what activities are safe for you. Keep all follow-up visits. This is important. How is this prevented? To lower your risk of getting this condition again: Wash your hands often with soap and water for at least 20 seconds. If soap and water are not available, use hand sanitizer. Avoid contact with people who have cold symptoms. Try not to touch your mouth, nose, or eyes with your hands. Avoid breathing in smoke or chemical fumes. Breathing smoke or chemical fumes will make your condition worse. Get the flu shot every year. Contact a health care  provider if: Your symptoms do not improve after 2 weeks. You have trouble coughing up the mucus. Your cough keeps you awake at night. You have a fever. Get help right away if you: Cough up blood. Feel pain in your chest. Have severe shortness of breath. Faint or keep feeling like you are going to faint. Have a severe headache. Have a fever or chills that get worse. These symptoms may represent a serious problem that is an emergency. Do not wait to see if the symptoms will go away. Get medical help right away. Call your local emergency services (911 in the [place] Do not drive yourself to the hospital. Summary Acute bronchitis is inflammation of the main airways (bronchi) that come off the windpipe (trachea) in the lungs. The swelling causes the airways to get smaller and make more mucus than normal. Drinking more fluids can help thin your mucus so it is easier to cough up. Take over-the-counter and prescription medicines only as told by your health care provider. Do not use any products that contain nicotine or tobacco. These products include cigarettes, chewing tobacco, and vaping devices, such as e-cigarettes. If you need help quitting, ask your health care provider. Contact a health  care provider if your symptoms do not improve after 2 weeks. This information is not intended to replace advice given to you by your health care provider. Make sure you discuss any questions you have with your health care provider. Document Revised: 12/21/2021 Document Reviewed: 01/11/2021 Elsevier Patient Education  [place].    If you have been instructed to have an in-person evaluation today at a local Urgent Care facility, please use the link below. It will take you to a list of all of our available [facility], including address, phone number and hours of operation. Please do not delay care.  [facility]  If you or a family member do not have a primary care provider,  use the link below to schedule a visit and establish care. When you choose a [facility] primary care physician or advanced practice provider, you gain a long-term partner in health. Find a Primary Care Provider  Learn more about [facility] in-office and virtual care options: [facility]

## 2022-07-30 NOTE — Telephone Encounter (Signed)
  Chief Complaint: Possible pneumonia.   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 Symptoms: Coughing up thick green, brown sputum, having chills and hot and cold.   Sore throat yesterday which is better today.   Headache.  Vomited once this morning. Frequency: Started Sunday morning. Pertinent Negatives: Patient denies Runny nose. Disposition: [] ED /[] Urgent Care (no appt availability in office) / [] Appointment(In office/virtual)/ [x]  [facility]/ [] Home Care/ [] Refused Recommended Disposition /[] [facility]/ []  Follow-up with PCP Additional Notes: No appts. Available with [facility] Juluis Mire, NP so she was agreeable to doing a MyChart Virtual Urgent Care visit.   She is familiar with how to schedule the visit so declined my help.

## 2022-07-30 NOTE — Telephone Encounter (Signed)
This encounter was created in error - please disregard.

## 2022-07-30 NOTE — Telephone Encounter (Signed)
Message from Luciana Axe sent at 07/30/2022  7:39 AM EST  Summary: Coughing with Phelm advise   Pt is calling to report that she has been coughing with a lot of phelm in her chest. No available office visits please advise          Call History   Type Contact Phone/Fax User  07/30/2022 07:38 AM EST Phone (Incoming) Meghan Galvan, Meghan Galvan (Self) [phone] Lemmie Evens) Blase Mess A   Reason for Disposition  Coughing up rusty-colored (reddish-brown) sputum  Answer Assessment - Initial Assessment Questions 1. ONSET: "When did the cough begin?"      *No Answer* 2. SEVERITY: "How bad is the cough today?"      I'm coughing up nasty sputum.   It's green and dark.   I vomited this morning.   I think I have pneumonia.   I've been exposed.   I feel congestion in my chest.    I'm sweating and hot and cold.    3. SPUTUM: "Describe the color of your sputum" (none, dry cough; clear, white, yellow, green)     Green and dark colored.   4. HEMOPTYSIS: "Are you coughing up any blood?" If so ask: "How much?" (flecks, streaks, tablespoons, etc.)     Not asked 5. DIFFICULTY BREATHING: "Are you having difficulty breathing?" If Yes, ask: "How bad is it?" (e.g., mild, moderate, severe)    - MILD: No SOB at rest, mild SOB with walking, speaks normally in sentences, can lie down, no retractions, pulse < 100.    - MODERATE: SOB at rest, SOB with minimal exertion and prefers to sit, cannot lie down flat, speaks in phrases, mild retractions, audible wheezing, pulse 100-120.    - SEVERE: Very SOB at rest, speaks in single words, struggling to breathe, sitting hunched forward, retractions, pulse > 120      No shortness of breath or chest tightness. 6. FEVER: "Do you have a fever?" If Yes, ask: "What is your temperature, how was it measured, and when did it start?"     I broke my thermometer this morning.    I'm having chills. 7. CARDIAC HISTORY: "Do you have any history of heart disease?" (e.g., heart attack,  congestive heart failure)      Not asked 8. LUNG HISTORY: "Do you have any history of lung disease?"  (e.g., pulmonary embolus, asthma, emphysema)     No 9. PE RISK FACTORS: "Do you have a history of blood clots?" (or: recent major surgery, recent prolonged travel, bedridden)     Not asked 10. OTHER SYMPTOMS: "Do you have any other symptoms?" (e.g., runny nose, wheezing, chest pain)       Chest congestion.   My throat was sore yesterday and I have a headache.   I'm drinking warm tea.   Symptoms started yesterday morning.   Covid tests are negative.   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   Pneumonia is a problem where I work in health care. 11. PREGNANCY: "Is there any chance you are pregnant?" "When was your last menstrual period?"       Not asked 12. TRAVEL: "Have you traveled out of the country in the last month?" (e.g., travel history, exposures)       Works in health care.  Protocols used: Cough - Acute Productive-A-AH

## 2022-07-30 NOTE — Progress Notes (Signed)
Patient completed VV instead

## 2022-07-30 NOTE — Progress Notes (Signed)
Virtual Visit Consent   Meghan Galvan, you are scheduled for a virtual visit with a [facility] provider today. Just as with appointments in the office, your consent must be obtained to participate. Your consent will be active for this visit and any virtual visit you may have with one of our providers in the next 365 days. If you have a MyChart account, a copy of this consent can be sent to you electronically.  As this is a virtual visit, video technology does not allow for your provider to perform a traditional examination. This may limit your provider's ability to fully assess your condition. If your provider identifies any concerns that need to be evaluated in person or the need to arrange testing (such as labs, EKG, etc.), we will make arrangements to do so. Although advances in technology are sophisticated, we cannot ensure that it will always work on either your end or our end. If the connection with a video visit is poor, the visit may have to be switched to a telephone visit. With either a video or telephone visit, we are not always able to ensure that we have a secure connection.  By engaging in this virtual visit, you consent to the provision of healthcare and authorize for your insurance to be billed (if applicable) for the services provided during this visit. Depending on your insurance coverage, you may receive a charge related to this service.  I need to obtain your verbal consent now. Are you willing to proceed with your visit today? Meghan Galvan has provided verbal consent on 07/30/2022 for a virtual visit (video or telephone). Mar Daring, PA-C  Date: 07/30/2022 8:43 AM  Virtual Visit via Video Note   I, Mar Daring, connected with  Meghan Galvan  ([id number], [id number]) on 07/30/22 at  8:30 AM EST by a video-enabled telemedicine application and verified that I am speaking with the correct person using two identifiers.  Location: Patient: Virtual Visit  Location Patient: Home Provider: Virtual Visit Location Provider: Home Office   I discussed the limitations of evaluation and management by telemedicine and the availability of in person appointments. The patient expressed understanding and agreed to proceed.    History of Present Illness: Meghan Galvan is a 46 y.o. who identifies as a female who was assigned female at birth, and is being seen today for cough.  HPI: Cough This is a new problem. The current episode started in the past 7 days (had sick exposure at work). The problem has been rapidly worsening. The problem occurs every few minutes. The cough is Productive of sputum. Associated symptoms include chills, a fever, headaches, myalgias, nasal congestion, postnasal drip, rhinorrhea, a sore throat (scratchy) and sweats. Associated symptoms comments: Vomiting. The symptoms are aggravated by lying down. She has tried OTC cough suppressant (lemon, ginger tea) for the symptoms. The treatment provided no relief. Her past medical history is significant for pneumonia. There is no history of asthma or bronchitis.    Problems: There are no problems to display for this patient.   Allergies: No Known Allergies Medications:  Current Outpatient Medications:    azithromycin (ZITHROMAX) 250 MG tablet, Take 2 tablets on day 1, then 1 tablet daily on days 2 through 5, Disp: 6 tablet, Rfl: 0   promethazine-dextromethorphan (PROMETHAZINE-DM) 6.25-15 MG/5ML syrup, Take 5 mLs by mouth 4 (four) times daily as needed., Disp: 118 mL, Rfl: 0   levothyroxine (SYNTHROID) 100 MCG tablet, TAKE 1 TABLET EVERY DAY ON EMPTY STOMACH 30 MINS-1  HR BEFORE FOOD FOR BEST ABSORPTION. RECHECK LAB IN 6 WEEKS AS NEEDED, Disp: 90 tablet, Rfl: 1  Observations/Objective: Patient is well-developed, well-nourished in no acute distress.  Resting comfortably at home.  Head is normocephalic, atraumatic.  No labored breathing.  Speech is clear and coherent with logical content.   Patient is alert and oriented at baseline.    Assessment and Plan: 1. Acute bacterial bronchitis - azithromycin (ZITHROMAX) 250 MG tablet; Take 2 tablets on day 1, then 1 tablet daily on days 2 through 5  Dispense: 6 tablet; Refill: 0 - promethazine-dextromethorphan (PROMETHAZINE-DM) 6.25-15 MG/5ML syrup; Take 5 mLs by mouth 4 (four) times daily as needed.  Dispense: 118 mL; Refill: 0  - Worsening over a week despite OTC medications - Will treat with Z-pack and Promethazine DM - Can continue Mucinex  - Push fluids.  - Rest.  - Steam and humidifier can help - Seek in person evaluation if worsening or symptoms fail to improve    Follow Up Instructions: I discussed the assessment and treatment plan with the patient. The patient was provided an opportunity to ask questions and all were answered. The patient agreed with the plan and demonstrated an understanding of the instructions.  A copy of instructions were sent to the patient via MyChart unless otherwise noted below.    The patient was advised to call back or seek an in-person evaluation if the symptoms worsen or if the condition fails to improve as anticipated.  Time:  I spent 10 minutes with the patient via telehealth technology discussing the above problems/concerns.    Margaretann Loveless, PA-C

## 2022-07-31 ENCOUNTER — Other Ambulatory Visit (INDEPENDENT_AMBULATORY_CARE_PROVIDER_SITE_OTHER): Payer: Self-pay | Admitting: Primary Care

## 2022-07-31 DIAGNOSIS — E039 Hypothyroidism, unspecified: Secondary | ICD-10-CM

## 2022-07-31 NOTE — Telephone Encounter (Signed)
Refilled 06/21/2022 #[id number] 1 rf. Requested Prescriptions  Pending Prescriptions Disp Refills   levothyroxine (SYNTHROID) 100 MCG tablet [Pharmacy Med Name: LEVOTHYROXINE 0.100MG  (100MCG) TAB] 60 tablet     Sig: TAKE 1 TABLET BY MOUTH DAILY ON EMPTY STOMACH 30MINS-1 HOUR BEFORE FOOD FOR BEST ABSORPTION     Endocrinology:  Hypothyroid Agents Passed - 07/31/2022  8:07 AM      Passed - TSH in normal range and within 360 days    TSH  Date Value Ref Range Status  06/18/2022 3.430 0.450 - 4.500 uIU/mL Final         Passed - Valid encounter within last 12 months    Recent Outpatient Visits           3 months ago Hypothyroidism, unspecified type   [place] Kerin Perna, NP   5 months ago Hypothyroidism, unspecified type   [place] Kerin Perna, NP   6 months ago Hypothyroidism, unspecified type   [place] Kerin Perna, NP   1 year ago Cervical cancer screening   [place], Bode, NP   1 year ago Hypothyroidism, unspecified type   [place] Kerin Perna, NP       Future Appointments             In 2 months Oletta Lamas, Milford Cage, NP [facility]

## 2022-09-28 ENCOUNTER — Telehealth (INDEPENDENT_AMBULATORY_CARE_PROVIDER_SITE_OTHER): Payer: Self-pay | Admitting: Primary Care

## 2022-09-28 NOTE — Telephone Encounter (Signed)
Attempted to reach patient to inform her of the visit on Monday would be virtual because provider is not in the office. Left a voicemail message. 

## 2022-10-01 ENCOUNTER — Telehealth (INDEPENDENT_AMBULATORY_CARE_PROVIDER_SITE_OTHER): Payer: Self-pay | Admitting: Primary Care

## 2022-10-01 DIAGNOSIS — R0989 Other specified symptoms and signs involving the circulatory and respiratory systems: Secondary | ICD-10-CM

## 2022-10-01 DIAGNOSIS — R059 Cough, unspecified: Secondary | ICD-10-CM

## 2022-10-01 NOTE — Progress Notes (Signed)
  [facility]   Telephone Note  I connected with Asherah Lavoy, on 10/01/2022 at 2:12 PM telephone and verified that I am speaking with the correct person using two identifiers.   Consent: I discussed the limitations, risks, security and privacy concerns of performing an evaluation and management service by telephone and the availability of in person appointments. I also discussed with the patient that there may be a patient responsible charge related to this service. The patient expressed understanding and agreed to proceed.   Location of Patient: Home   Location of Provider: [facility] [place]   Persons participating in Telemedicine visit: Lyla Son,  NP  History of Present Illness: Ms.Meghan Galvan is a 47 year old female with 2 COVID test  at work was negative .  However she has been experiencing night sweats, congestion, and productive cough.   Past Medical History:  Diagnosis Date   Thyroid disease    Hypothyroidism   No Known Allergies  Current Outpatient Medications on File Prior to Visit  Medication Sig Dispense Refill   levothyroxine (SYNTHROID) 100 MCG tablet TAKE 1 TABLET EVERY DAY ON EMPTY STOMACH 30 MINS-1 HR BEFORE FOOD FOR BEST ABSORPTION. RECHECK LAB IN 6 WEEKS AS NEEDED 90 tablet 1   promethazine-dextromethorphan (PROMETHAZINE-DM) 6.25-15 MG/5ML syrup Take 5 mLs by mouth 4 (four) times daily as needed. 118 mL 0   No current facility-administered medications on file prior to visit.    See HPI  Assessment and Plan: Diagnoses and all orders for this visit:  Respiratory symptoms To determine diagnosis patient would need to have a respiratory panel done in office or pharmacies that or testing.  Treat the signs and symptoms increase water, take over-the-counter cough medicine as needed.  If continues to have problems will need to be seen in person in respiratory panel  performed    Follow Up Instructions: First available if symptoms continue   I discussed the assessment and treatment plan with the patient. The patient was provided an opportunity to ask questions and all were answered. The patient agreed with the plan and demonstrated an understanding of the instructions.   The patient was advised to call back or seek an in-person evaluation if the symptoms worsen or if the condition fails to improve as anticipated.     I provided 10 minutes total of non-face-to-face time during this encounter including median intraservice time, reviewing previous notes, investigations, ordering medications, medical decision making, coordinating care and patient verbalized understanding at the end of the visit.    This note has been created with Surveyor, quantity. Any transcriptional errors are unintentional.   Kerin Perna, NP 10/01/2022, 2:12 PM

## 2022-10-18 ENCOUNTER — Encounter (INDEPENDENT_AMBULATORY_CARE_PROVIDER_SITE_OTHER): Payer: Self-pay | Admitting: Primary Care

## 2022-10-18 ENCOUNTER — Ambulatory Visit (INDEPENDENT_AMBULATORY_CARE_PROVIDER_SITE_OTHER): Payer: Self-pay | Admitting: Primary Care

## 2022-10-18 VITALS — BP 110/77 | HR 76 | Resp 16 | Wt 166.8 lb

## 2022-10-18 DIAGNOSIS — R300 Dysuria: Secondary | ICD-10-CM

## 2022-10-18 LAB — POCT URINALYSIS DIP (CLINITEK)
Bilirubin, UA: NEGATIVE
Glucose, UA: NEGATIVE mg/dL
Ketones, POC UA: NEGATIVE mg/dL
Leukocytes, UA: NEGATIVE
Nitrite, UA: NEGATIVE
Spec Grav, UA: 1.02 (ref 1.010–1.025)
Urobilinogen, UA: 0.2 E.U./dL
pH, UA: 7 (ref 5.0–8.0)

## 2022-10-18 NOTE — Progress Notes (Signed)
Subjective:    Meghan Galvan is a 47 y.o. female who complains of burning with urination, dysuria, frequency, and urgency for 4 days.  Patient also complains of back pain and dysuria . Patient denies congestion, cough, fever, headache, rhinitis, sorethroat, stomach ache, and vaginal discharge.  Patient does have a history of recurrent UTI.  Patient does not have a history of pyelonephritis. The following portions of the patient's history were reviewed and updated as appropriate: allergies, current medications, past family history, past medical history, past social history, past surgical history, and problem list. Review of Systems Pertinent items noted in HPI and remainder of comprehensive ROS otherwise negative.    Objective:    Blood Pressure 110/77   Pulse 76   Respiration 16   Weight 166 lb 12.8 oz (75.7 kg)   Oxygen Saturation 96%   Body Mass Index 31.52 kg/m   Physical Exam Vitals reviewed.  Constitutional:      Appearance: She is obese.  HENT:     Head: Normocephalic.     Right Ear: Tympanic membrane normal.     Left Ear: Tympanic membrane normal.     Nose: Nose normal.  Eyes:     Extraocular Movements: Extraocular movements intact.  Cardiovascular:     Rate and Rhythm: Normal rate and regular rhythm.  Pulmonary:     Breath sounds: Rhonchi present.  Abdominal:     General: Bowel sounds are normal.     Palpations: Abdomen is soft.  Musculoskeletal:        General: Normal range of motion.     Cervical back: Normal range of motion and neck supple.  Skin:    General: Skin is warm and dry.  Neurological:     Mental Status: She is oriented to person, place, and time.  Psychiatric:        Mood and Affect: Mood normal.     Laboratory:  Urine dipstick shows negative for all components.   Micro exam: not done.    Assessment:  Genital irritation and UTI     1. Medications: not indicated at this time 2. Maintain adequate hydration 3. Follow up if symptoms not  improving, and prn.

## 2022-11-01 ENCOUNTER — Other Ambulatory Visit (HOSPITAL_COMMUNITY)
Admission: RE | Admit: 2022-11-01 | Discharge: 2022-11-01 | Disposition: A | Discharge status: Home / Self Care | Payer: Self-pay | Source: Ambulatory Visit | Attending: Primary Care | Admitting: Primary Care

## 2022-11-01 ENCOUNTER — Encounter (INDEPENDENT_AMBULATORY_CARE_PROVIDER_SITE_OTHER): Payer: Self-pay | Admitting: Primary Care

## 2022-11-01 ENCOUNTER — Ambulatory Visit (INDEPENDENT_AMBULATORY_CARE_PROVIDER_SITE_OTHER): Payer: Self-pay | Admitting: Primary Care

## 2022-11-01 VITALS — BP 119/78 | HR 69 | Resp 16 | Wt 168.4 lb

## 2022-11-01 DIAGNOSIS — Z1231 Encounter for screening mammogram for malignant neoplasm of breast: Secondary | ICD-10-CM

## 2022-11-01 DIAGNOSIS — Z1211 Encounter for screening for malignant neoplasm of colon: Secondary | ICD-10-CM

## 2022-11-01 DIAGNOSIS — N898 Other specified noninflammatory disorders of vagina: Secondary | ICD-10-CM | POA: Insufficient documentation

## 2022-11-01 DIAGNOSIS — E039 Hypothyroidism, unspecified: Secondary | ICD-10-CM

## 2022-11-01 NOTE — Progress Notes (Signed)
Meghan  Deepti Galvan, is a 47 y.o. female  LYY:[id number]  LEX:[id number]  DOB - 1975-12-03  Chief Complaint  Patient presents with   vaginal odor       Subjective:   Ms.Meghan Galvan is a 47 y.o. female here today for a acute visit. She voices concerns about vaginal odor same sex partner but he uses scented soaps. Denies slow metabolism, fatigue, weight gain, dry hair/skin, constipation, swelling, decreased memory/brain fog.   Patient has No headache, No chest pain, No abdominal pain - No Nausea, No new weakness tingling or numbness, No Cough - shortness of breath  No problems updated.  No Known Allergies  Past Medical History:  Diagnosis Date   Thyroid disease    Hypothyroidism    Current Outpatient Medications on File Prior to Visit  Medication Sig Dispense Refill   levothyroxine (SYNTHROID) 100 MCG tablet TAKE 1 TABLET EVERY DAY ON EMPTY STOMACH 30 MINS-1 HR BEFORE FOOD FOR BEST ABSORPTION. RECHECK LAB IN 6 WEEKS AS NEEDED 90 tablet 1   promethazine-dextromethorphan (PROMETHAZINE-DM) 6.25-15 MG/5ML syrup Take 5 mLs by mouth 4 (four) times daily as needed. (Patient not taking: Reported on 10/18/2022) 118 mL 0   No current facility-administered medications on file prior to visit.    Objective:   Vitals:   11/01/22 1344  BP: 119/78  Pulse: 69  Resp: 16  SpO2: 99%  Weight: 168 lb 6.4 oz (76.4 kg)    Comprehensive ROS Pertinent positive and negative noted in HPI   Exam General appearance : Awake, alert, not in any distress. Speech Clear. Not toxic looking HEENT: Atraumatic and Normocephalic, pupils equally reactive to light and accomodation Neck: Supple, no JVD. No cervical lymphadenopathy.  Chest: Good air entry bilaterally, no added sounds  CVS: S1 S2 regular, no murmurs.  Abdomen: Bowel sounds present, Non tender and not distended with no gaurding, rigidity or rebound. Extremities: B/L Lower Ext shows no edema, both legs are warm to  touch Neurology: Awake alert, and oriented X 3, CN II-XII intact, Non focal Skin: No Rash  Data Review No results found for: "HGBA1C"  Assessment & Plan   [place] was seen today for vaginal odor.  Diagnoses and all orders for this visit:  Vaginal odor -     Cervicovaginal ancillary only  Encounter for screening mammogram for malignant neoplasm of breast Patient completed application for BCCP while in clinic and application has and faxed to [facility]. Patient aware that [facility] will contact her directly to schedule appointment.  -     MM DIGITAL SCREENING BILATERAL; Future  Colon cancer screening -     Fecal occult blood, imunochemical; Future  Hypothyroidism, unspecified type -     TSH + free T4    Patient have been counseled extensively about nutrition and exercise. Other issues discussed during this visit include: low cholesterol diet, weight control and daily exercise, foot care, annual eye examinations at Ophthalmology, importance of adherence with medications and regular follow-up. We also discussed long term complications of uncontrolled diabetes and hypertension.   Return for annual physical.  The patient was given clear instructions to go to ER or return to medical center if symptoms don't improve, worsen or new problems develop. The patient verbalized understanding. The patient was told to call to get lab results if they haven't heard anything in the next week.   This note has been created with Surveyor, quantity. Any transcriptional errors are unintentional.   Sharyn Lull  Harriette Ohara, NP 11/01/2022, 2:08 PM

## 2022-11-02 LAB — TSH+FREE T4
Free T4: 1.14 ng/dL (ref 0.82–1.77)
TSH: 3.56 u[IU]/mL (ref 0.450–4.500)

## 2022-11-06 ENCOUNTER — Other Ambulatory Visit (INDEPENDENT_AMBULATORY_CARE_PROVIDER_SITE_OTHER): Payer: Self-pay | Admitting: Primary Care

## 2022-11-06 DIAGNOSIS — E039 Hypothyroidism, unspecified: Secondary | ICD-10-CM

## 2022-11-06 LAB — CERVICOVAGINAL ANCILLARY ONLY
Bacterial Vaginitis (gardnerella): POSITIVE — AB
Candida Glabrata: NEGATIVE
Candida Vaginitis: NEGATIVE
Chlamydia: NEGATIVE
Comment: NEGATIVE
Comment: NEGATIVE
Comment: NEGATIVE
Comment: NEGATIVE
Comment: NEGATIVE
Comment: NORMAL
Neisseria Gonorrhea: NEGATIVE
Trichomonas: NEGATIVE

## 2022-11-06 MED ORDER — METRONIDAZOLE 500 MG PO TABS: 500.0000 mg | ORAL_TABLET | Freq: Two times a day (BID) | ORAL | 0 refills | Status: DC

## 2022-11-06 MED ORDER — LEVOTHYROXINE SODIUM 100 MCG PO TABS: ORAL_TABLET | ORAL | 1 refills | Status: DC

## 2022-12-05 ENCOUNTER — Encounter (INDEPENDENT_AMBULATORY_CARE_PROVIDER_SITE_OTHER): Payer: Self-pay

## 2023-01-04 ENCOUNTER — Inpatient Hospital Stay: Admission: RE | Admit: 2023-01-04 | Payer: Self-pay | Source: Ambulatory Visit

## 2023-01-15 ENCOUNTER — Other Ambulatory Visit (INDEPENDENT_AMBULATORY_CARE_PROVIDER_SITE_OTHER): Payer: Self-pay | Admitting: Primary Care

## 2023-01-16 ENCOUNTER — Other Ambulatory Visit (INDEPENDENT_AMBULATORY_CARE_PROVIDER_SITE_OTHER): Payer: Self-pay | Admitting: Primary Care

## 2023-01-24 ENCOUNTER — Other Ambulatory Visit (INDEPENDENT_AMBULATORY_CARE_PROVIDER_SITE_OTHER): Payer: Self-pay | Admitting: Primary Care

## 2023-01-24 DIAGNOSIS — E039 Hypothyroidism, unspecified: Secondary | ICD-10-CM

## 2023-01-30 ENCOUNTER — Telehealth (INDEPENDENT_AMBULATORY_CARE_PROVIDER_SITE_OTHER): Payer: Self-pay | Admitting: Primary Care

## 2023-01-30 DIAGNOSIS — N898 Other specified noninflammatory disorders of vagina: Secondary | ICD-10-CM

## 2023-02-10 NOTE — Progress Notes (Signed)
  [facility]  Virtual Visit via Telephone Note  I connected with Meghan Galvan, on Jan 30, 2023 at 5:55 PM by telephone and verified that I am speaking with the correct person using two identifiers.   Consent: I discussed the limitations, risks, security and privacy concerns of performing an evaluation and management service by telephone and the availability of in person appointments. I also discussed with the patient that there may be a patient responsible charge related to this service. The patient expressed understanding and agreed to proceed.   Location of Patient: Home  Location of Provider: [facility] at [facility]   Persons participating in Telemedicine visit: Loura Back,  NP   History of Present Illness: Ms.Meghan Galvan is a 47 year old female with complaints and concerns of vaginal discharge and malodorous.  Explained will need to come into the office for a swab when results return treatment will be rendered.  Patient in agreement   Past Medical History:  Diagnosis Date   Thyroid disease    Hypothyroidism   No Known Allergies  Current Outpatient Medications on File Prior to Visit  Medication Sig Dispense Refill   levothyroxine (SYNTHROID) 100 MCG tablet TAKE 1 TABLET BY MOUTH EVERY DAY ON AN EMPTY STOMACH 30 MINUTES TO 1 HOUR BEFORE FOOD. 90 tablet 1   metroNIDAZOLE (FLAGYL) 500 MG tablet Take 1 tablet (500 mg total) by mouth 2 (two) times daily. 14 tablet 0   promethazine-dextromethorphan (PROMETHAZINE-DM) 6.25-15 MG/5ML syrup Take 5 mLs by mouth 4 (four) times daily as needed. (Patient not taking: Reported on 10/18/2022) 118 mL 0   No current facility-administered medications on file prior to visit.    Observations/Objective: There were no vitals taken for this visit. See HPI Assessment and Plan: Diagnoses and all orders for this visit:  Vaginal discharge -     Cervicovaginal  ancillary only     Follow Up Instructions:    I discussed the assessment and treatment plan with the patient. The patient was provided an opportunity to ask questions and all were answered. The patient agreed with the plan and demonstrated an understanding of the instructions.   The patient was advised to call back or seek an in-person evaluation if the symptoms worsen or if the condition fails to improve as anticipated.     I provided 10 minutes total of non-face-to-face time during this encounter including median intraservice time, reviewing previous notes, investigations, ordering medications, medical decision making, coordinating care and patient verbalized understanding at the end of the visit.    This note has been created with Education officer, environmental. Any transcriptional errors are unintentional.   Grayce Sessions, NP 02/10/2023, 5:55 PM
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
## 2023-03-08 ENCOUNTER — Emergency Department (HOSPITAL_BASED_OUTPATIENT_CLINIC_OR_DEPARTMENT_OTHER): Payer: Self-pay | Admitting: Radiology

## 2023-03-08 ENCOUNTER — Emergency Department (HOSPITAL_BASED_OUTPATIENT_CLINIC_OR_DEPARTMENT_OTHER)
Admission: EM | Admit: 2023-03-08 | Discharge: 2023-03-08 | Disposition: A | Discharge status: Home / Self Care | Payer: Self-pay | Attending: Emergency Medicine | Admitting: Emergency Medicine

## 2023-03-08 ENCOUNTER — Encounter (HOSPITAL_BASED_OUTPATIENT_CLINIC_OR_DEPARTMENT_OTHER): Payer: Self-pay

## 2023-03-08 DIAGNOSIS — X58XXXA Exposure to other specified factors, initial encounter: Secondary | ICD-10-CM | POA: Insufficient documentation

## 2023-03-08 DIAGNOSIS — S29011A Strain of muscle and tendon of front wall of thorax, initial encounter: Secondary | ICD-10-CM | POA: Insufficient documentation

## 2023-03-08 DIAGNOSIS — Z79890 Hormone replacement therapy (postmenopausal): Secondary | ICD-10-CM | POA: Insufficient documentation

## 2023-03-08 DIAGNOSIS — E039 Hypothyroidism, unspecified: Secondary | ICD-10-CM | POA: Insufficient documentation

## 2023-03-08 HISTORY — DX: Ventral hernia without obstruction or gangrene: K43.9

## 2023-03-08 LAB — BASIC METABOLIC PANEL
Anion gap: 7 (ref 5–15)
BUN: 8 mg/dL (ref 6–20)
CO2: 24 mmol/L (ref 22–32)
Calcium: 9 mg/dL (ref 8.9–10.3)
Chloride: 105 mmol/L (ref 98–111)
Creatinine, Ser: 0.64 mg/dL (ref 0.44–1.00)
GFR, Estimated: >60 mL/min (ref >60)
Glucose, Bld: 91 mg/dL (ref 70–99)
Potassium: 3.9 mmol/L (ref 3.5–5.1)
Sodium: 136 mmol/L (ref 135–145)

## 2023-03-08 LAB — CBC
HCT: 37.3 % (ref 36.0–46.0)
Hemoglobin: 12.7 g/dL (ref 12.0–15.0)
MCH: 30.6 pg (ref 26.0–34.0)
MCHC: 34 g/dL (ref 30.0–36.0)
MCV: 89.9 fL (ref 80.0–100.0)
Platelets: 277 10*3/uL (ref 150–400)
RBC: 4.15 MIL/uL (ref 3.87–5.11)
RDW: 14.3 % (ref 11.5–15.5)
WBC: 3.8 10*3/uL — ABNORMAL LOW (ref 4.0–10.5)
nRBC: 0 % (ref 0.0–0.2)

## 2023-03-08 LAB — TROPONIN I (HIGH SENSITIVITY)
Troponin I (High Sensitivity): <2 ng/L (ref <18)
Troponin I (High Sensitivity): <2 ng/L (ref <18)

## 2023-03-08 LAB — PREGNANCY, URINE: Preg Test, Ur: NEGATIVE

## 2023-03-08 MED ORDER — ALUM & MAG HYDROXIDE-SIMETH 200-200-20 MG/5ML PO SUSP
30.0000 mL | Freq: Once | ORAL | Status: AC
  Administered 2023-03-08: 30 mL via ORAL
  Filled 2023-03-08: qty 30

## 2023-03-08 NOTE — ED Triage Notes (Signed)
Onset this am of left side chest pain radiating into back shoulder.  States worse with movement.

## 2023-03-08 NOTE — Discharge Instructions (Addendum)
As we discussed, your work-up in the ER today was reassuring for acute findings.  Chest x-ray, EKG, and laboratory evaluation did not reveal any emergent concerns.  Given that your pain is worse with movement, I suspect that it is muscular in nature.  I recommend that you rest and ice and take tylenol/ibuprofen for pain.  Follow-up with your primary care doctor in the next few days as well.  Return if development of any new or worsening symptoms.

## 2023-03-08 NOTE — ED Notes (Signed)
Performed intentional rounding, checked to make sure pulse ox, EKG leads and b/p cuff were all connected. asked patient if she needed anything, patient advised she was fine and didn't need anything.

## 2023-03-08 NOTE — ED Notes (Signed)
Pt aware of the need for a urine... Unable to currently collect.... 

## 2023-03-08 NOTE — ED Provider Notes (Signed)
[place] EMERGENCY DEPARTMENT AT [facility] Provider Note   CSN: [id number] Arrival date & time: 03/08/23  4098     History  Chief Complaint  Patient presents with   Chest Pain    Meghan Galvan is a 47 y.o. female.  Patient with history of hypothyroidism presents today with complaints of chest pain. She states that same began this morning when she was walking downstairs to go make coffee after she got out of bed this morning. Pain is left sided in nature under her left breast and radiates to her left shoulder. Pain is worse with movement. It is not pleuritic. She denies any shortness of breath, diaphoresis, nausea, or vomiting. She does not smoke and denies any recreational drug use. She denies any recent heavy lifting or overuse. Denies any cardiac history. No recent travel or recent surgeries. No leg pain or leg swelling. She is not on OCPs. Denies any history of similar symptoms previously.  The history is provided by the patient. No language interpreter was used.  Chest Pain      Home Medications Prior to Admission medications   Medication Sig Start Date End Date Taking? Authorizing Provider  levothyroxine (SYNTHROID) 100 MCG tablet TAKE 1 TABLET BY MOUTH EVERY DAY ON AN EMPTY STOMACH 30 MINUTES TO 1 HOUR BEFORE FOOD. 01/24/23   Grayce Sessions, NP  metroNIDAZOLE (FLAGYL) 500 MG tablet Take 1 tablet (500 mg total) by mouth 2 (two) times daily. 11/06/22   Grayce Sessions, NP  promethazine-dextromethorphan (PROMETHAZINE-DM) 6.25-15 MG/5ML syrup Take 5 mLs by mouth 4 (four) times daily as needed. Patient not taking: Reported on 10/18/2022 07/30/22   Margaretann Loveless, PA-C      Allergies    Patient has no known allergies.    Review of Systems   Review of Systems  Cardiovascular:  Positive for chest pain.  All other systems reviewed and are negative.   Physical Exam Updated Vital Signs BP 133/83   Pulse (!) 59   Temp 98.9 F (37.2 C) (Oral)   Resp  15   Ht 5\' 1"  (1.549 m)   Wt 81.6 kg   SpO2 100%   BMI 34.01 kg/m  Physical Exam Vitals and nursing note reviewed.  Constitutional:      General: She is not in acute distress.    Appearance: Normal appearance. She is normal weight. She is not ill-appearing, toxic-appearing or diaphoretic.  HENT:     Head: Normocephalic and atraumatic.  Cardiovascular:     Rate and Rhythm: Normal rate and regular rhythm.     Pulses:          Radial pulses are 2+ on the left side.       Dorsalis pedis pulses are 2+ on the right side and 2+ on the left side.       Posterior tibial pulses are 2+ on the right side and 2+ on the left side.     Heart sounds: Normal heart sounds.  Pulmonary:     Effort: Pulmonary effort is normal. No respiratory distress.     Breath sounds: Normal breath sounds.  Chest:     Comments: TTP left anterior chest wall. Pain triggered by left shoulder ROM Abdominal:     Palpations: Abdomen is soft.     Tenderness: There is no abdominal tenderness.  Musculoskeletal:        General: Normal range of motion.     Cervical back: Normal range of motion.  Right lower leg: No tenderness. No edema.     Left lower leg: No tenderness. No edema.  Skin:    General: Skin is warm and dry.  Neurological:     General: No focal deficit present.     Mental Status: She is alert.  Psychiatric:        Mood and Affect: Mood normal.        Behavior: Behavior normal.     ED Results / Procedures / Treatments   Labs (all labs ordered are listed, but only abnormal results are displayed) Labs Reviewed  CBC - Abnormal; Notable for the following components:      Result Value   WBC 3.8 (*)    All other components within normal limits  BASIC METABOLIC PANEL  PREGNANCY, URINE  TROPONIN I (HIGH SENSITIVITY)  TROPONIN I (HIGH SENSITIVITY)    EKG EKG Interpretation  Date/Time:  Friday March 08 2023 09:25:56 EDT Ventricular Rate:  60 PR Interval:  143 QRS Duration: 79 QT  Interval:  390 QTC Calculation: 390 R Axis:   75 Text Interpretation: Sinus rhythm No old tracing to compare Confirmed by Jacalyn Lefevre [phone]) on 03/08/2023 9:53:02 AM  Radiology DG Chest 2 View  Result Date: 03/08/2023 CLINICAL DATA:  chest pain EXAM: CHEST - 2 VIEW COMPARISON:  None FINDINGS: No pleural effusion. No pneumothorax. No focal airspace opacity. No radiographically apparent displaced rib fractures. Normal cardiac and mediastinal contours. Visualized upper abdomen is unremarkable. Vertebral body heights are maintained. IMPRESSION: No focal airspace opacity. Electronically Signed   By: Lorenza Cambridge M.D.   On: 03/08/2023 09:59    Procedures Procedures    Medications Ordered in ED Medications  alum & mag hydroxide-simeth (MAALOX/MYLANTA) 200-200-20 MG/5ML suspension 30 mL (30 mLs Oral Given 03/08/23 1120)    ED Course/ Medical Decision Making/ A&P             HEART Score: 1                Medical Decision Making Amount and/or Complexity of Data Reviewed Labs: ordered. Radiology: ordered.   This patient is a 47 y.o. female who presents to the ED for concern of chest pain, this involves an extensive number of treatment options, and is a complaint that carries with it a high risk of complications and morbidity. The emergent differential diagnosis prior to evaluation includes, but is not limited to,  ACS, pericarditis, myocarditis, aortic dissection, PE, pneumothorax, esophageal spasm or rupture, chronic angina, pneumonia, bronchitis, GERD, reflux/PUD, biliary disease, pancreatitis, costochondritis, anxiety This is not an exhaustive differential.   Past Medical History / Co-morbidities / Social History: history of hypothyroidism  Physical Exam: Physical exam performed. The pertinent findings include: per above, chest TTP left side, pain with ranging left shoulder. Good distal pulses  Lab Tests: I ordered, and personally interpreted labs.  The pertinent results include:   troponin negative and flat, no pertinent laboratory findings.   Imaging Studies: I ordered imaging studies including CXR. I independently visualized and interpreted imaging which showed NAD. I agree with the radiologist interpretation.   Cardiac Monitoring:  The patient was maintained on a cardiac monitor.  My attending physician Dr. Particia Nearing viewed and interpreted the cardiac monitored which showed an underlying rhythm of: sinus rhythm. I agree with this interpretation.   Medications: I ordered medication including Gi cocktail  for pain. Reevaluation of the patient after these medicines showed that the patient stayed the same. I have reviewed the patients home medicines and  have made adjustments as needed.   Disposition: After consideration of the diagnostic results and the patients response to treatment, I feel that emergency department workup does not suggest an emergent condition requiring admission or immediate intervention beyond what has been performed at this time. The plan is: discharge with close outpatient follow-up and return precautions. Patient is low risk HEART score with no comorbid risk factors. Pain triggered by left arm movement, likely muscular in nature. She is PERC negative. No risk factors or symptoms to suggest dissection. Plan for RICE and tylenol/ibuprofen for pain. Evaluation and diagnostic testing in the emergency department does not suggest an emergent condition requiring admission or immediate intervention beyond what has been performed at this time.  Plan for discharge with close PCP follow-up.  Patient is understanding and amenable with plan, educated on red flag symptoms that would prompt immediate return.  Patient discharged in stable condition.  Final Clinical Impression(s) / ED Diagnoses Final diagnoses:  Muscle strain of anterior chest wall    Rx / DC Orders ED Discharge Orders     None     An After Visit Summary was printed and given to the  patient.     Vear Clock 03/08/23 1404    Jacalyn Lefevre, MD 03/08/23 1537

## 2023-09-03 ENCOUNTER — Encounter (INDEPENDENT_AMBULATORY_CARE_PROVIDER_SITE_OTHER): Payer: Self-pay | Admitting: Primary Care

## 2023-09-03 ENCOUNTER — Encounter (INDEPENDENT_AMBULATORY_CARE_PROVIDER_SITE_OTHER): Payer: Self-pay

## 2023-09-09 NOTE — Progress Notes (Signed)
Patient on menstrual cycle.

## 2023-09-16 ENCOUNTER — Telehealth (INDEPENDENT_AMBULATORY_CARE_PROVIDER_SITE_OTHER): Payer: Self-pay | Admitting: Primary Care

## 2023-09-16 NOTE — Telephone Encounter (Signed)
 Called to remind pt about apt. VM left

## 2023-09-17 ENCOUNTER — Other Ambulatory Visit (HOSPITAL_COMMUNITY)
Admission: RE | Admit: 2023-09-17 | Discharge: 2023-09-17 | Disposition: A | Discharge status: Home / Self Care | Payer: Self-pay | Source: Ambulatory Visit | Attending: Primary Care | Admitting: Primary Care

## 2023-09-17 ENCOUNTER — Ambulatory Visit (INDEPENDENT_AMBULATORY_CARE_PROVIDER_SITE_OTHER): Payer: Self-pay | Admitting: Primary Care

## 2023-09-17 VITALS — BP 111/77 | HR 75 | Resp 16 | Ht 61.0 in | Wt 188.4 lb

## 2023-09-17 DIAGNOSIS — N898 Other specified noninflammatory disorders of vagina: Secondary | ICD-10-CM

## 2023-09-17 DIAGNOSIS — R300 Dysuria: Secondary | ICD-10-CM

## 2023-09-17 DIAGNOSIS — Z1211 Encounter for screening for malignant neoplasm of colon: Secondary | ICD-10-CM

## 2023-09-17 DIAGNOSIS — Z2821 Immunization not carried out because of patient refusal: Secondary | ICD-10-CM

## 2023-09-17 LAB — POCT URINALYSIS DIP (CLINITEK)
Bilirubin, UA: NEGATIVE
Glucose, UA: NEGATIVE mg/dL
Ketones, POC UA: NEGATIVE mg/dL
Leukocytes, UA: NEGATIVE
Nitrite, UA: NEGATIVE
POC PROTEIN,UA: NEGATIVE
Spec Grav, UA: 1.025 (ref 1.010–1.025)
Urobilinogen, UA: 0.2 U/dL
pH, UA: 6 (ref 5.0–8.0)

## 2023-09-17 NOTE — Progress Notes (Signed)
[facility]   Subjective:   Meghan Galvan is a 47 y.o. G1P0 female complains of an abnormal vaginal discharge for 2 months. Discharge described as: malodorous. Vaginal symptoms include dyspareunia, odor, and pain.Vulvar symptoms include odor and urinary symptoms of urinary hesitancy and urinary retention.STI Risk: Very low risk of STD exposure.   Other associated symptoms: odor.Menstrual pattern: She had been bleeding none. Contraception: condoms.  She reports abdominal pain recent antibiotic exposure, reports no changes in soaps, detergents coinciding with the onset of her symptoms.  She has previously self treated or been under treatment by another provider for these symptoms.     Gynecologic History Patient's last menstrual period was 09/03/2023. Contraception: none Health Maintenance  Topic Date Due   Colonoscopy  Never done   COVID-19 Vaccine (3 - 2024-25 season) 05/26/2023   INFLUENZA VACCINE  12/23/2023 (Originally 04/25/2023)   Cervical Cancer Screening (HPV/Pap Cotest)  06/13/2026   DTaP/Tdap/Td (2 - Td or Tdap) 06/28/2027   Hepatitis C Screening  Completed   HIV Screening  Completed   HPV VACCINES  Aged Out    Past Medical History:  Diagnosis Date   Hernia of abdominal wall    Thyroid disease    Hypothyroidism    Past Surgical History:  Procedure Laterality Date   THYROIDECTOMY  March 2016    Current Outpatient Medications on File Prior to Visit  Medication Sig Dispense Refill   levothyroxine (SYNTHROID) 100 MCG tablet TAKE 1 TABLET BY MOUTH EVERY DAY ON AN EMPTY STOMACH 30 MINUTES TO 1 HOUR BEFORE FOOD. 90 tablet 1   No current facility-administered medications on file prior to visit.    No Known Allergies  Social History   Socioeconomic History   Marital status: Single    Spouse name: Not on file   Number of children: Not on file   Years of education: Not on file   Highest education level: Associate degree: occupational, Scientist, product/process development, or  vocational program  Occupational History   Not on file  Tobacco Use   Smoking status: Some Days    Current packs/day: 0.25    Types: Cigarettes   Smokeless tobacco: Never  Vaping Use   Vaping status: Never Used  Substance and Sexual Activity   Alcohol use: Not Currently    Alcohol/week: 4.0 standard drinks of alcohol    Types: 4 Glasses of wine per week    Comment: occasional   Drug use: No   Sexual activity: Yes  Other Topics Concern   Not on file  Social History Narrative   Not on file   Social Drivers of Health   Financial Resource Strain: Medium Risk (09/17/2023)   Overall Financial Resource Strain (CARDIA)    Difficulty of Paying Living Expenses: Somewhat hard  Food Insecurity: Food Insecurity Present (09/17/2023)   Hunger Vital Sign    Worried About Running Out of Food in the Last Year: Sometimes true    Ran Out of Food in the Last Year: Sometimes true  Transportation Needs: No Transportation Needs (09/17/2023)   PRAPARE - Administrator, Civil Service (Medical): No    Lack of Transportation (Non-Medical): No  Physical Activity: Sufficiently Active (09/17/2023)   Exercise Vital Sign    Days of Exercise per Week: 5 days    Minutes of Exercise per Session: 40 min  Recent Concern: Physical Activity - Insufficiently Active (09/03/2023)   Exercise Vital Sign    Days of Exercise per Week: 3 days  Minutes of Exercise per Session: 40 min  Stress: No Stress Concern Present (09/17/2023)   [employer] of Occupational Health - Occupational Stress Questionnaire    Feeling of Stress : Not at all  Social Connections: Socially Isolated (09/17/2023)   Social Connection and Isolation Panel [NHANES]    Frequency of Communication with Friends and Family: More than three times a week    Frequency of Social Gatherings with Friends and Family: Three times a week    Attends Religious Services: Never    Active Member of Clubs or Organizations: No    Attends Tax inspector Meetings: Not on file    Marital Status: Separated  Intimate Partner Violence: Not on file    No family history on file.  The following portions of the patient's history were reviewed and updated as appropriate: allergies, current medications, past family history, past medical history, past social history, past surgical history and problem list.  Review of Systems Comprehensive ROS Pertinent positive and negative noted in HPI    Objective:  Pulse 75   Resp 16   Ht 5\' 1"  (1.549 m)   Wt 188 lb 6.4 oz (85.5 kg)   LMP 09/03/2023   SpO2 100%   BMI 35.60 kg/m   Physical Exam Vitals reviewed.  HENT:     Head: Normocephalic.     Right Ear: Tympanic membrane, ear canal and external ear normal.     Left Ear: Tympanic membrane, ear canal and external ear normal.     Nose: Nose normal.     Mouth/Throat:     Mouth: Mucous membranes are moist.  Eyes:     Extraocular Movements: Extraocular movements intact.     Pupils: Pupils are equal, round, and reactive to light.  Cardiovascular:     Rate and Rhythm: Normal rate.  Pulmonary:     Effort: Pulmonary effort is normal.     Breath sounds: Normal breath sounds.  Abdominal:     General: Bowel sounds are normal.     Palpations: Abdomen is soft.  Genitourinary:    Vagina: Vaginal discharge present.  Musculoskeletal:        General: Normal range of motion.     Cervical back: Normal range of motion.  Skin:    General: Skin is warm and dry.  Neurological:     Mental Status: She is alert and oriented to person, place, and time.  Psychiatric:        Mood and Affect: Mood normal.        Behavior: Behavior normal.        Thought Content: Thought content normal.      Assessment:  Vaginal discharge -     Cervicovaginal ancillary only  Dysuria -     POCT URINALYSIS DIP (CLINITEK)    If tests results are positive, please abstain from sexual activity until you and your partner(s) have been treated Return precautions  given to come here or go to ER if you have any new or worsening symptoms fever, chills, nausea, vomiting, abdominal or pelvic pain, painful intercourse, vaginal discharge, vaginal bleeding, persistent symptoms despite treatment, etc...  Reviewed expectations re: course of current medical issues. Questions answered. Outlined signs and symptoms indicating need for more acute intervention. Patient verbalized understanding. After Visit Summary given.   Plan:   Will follow up on all labs ordered today.  Patient counseled regarding condom use with each sexual activity to promote wellness and prevention of transmission of HIV, syphilis, herpes simplex  virus, gonorrhea, chlamydia and trichomoniasis.   This note has been created with Education officer, environmental. Any transcriptional errors are unintentional.   Grayce Sessions, NP 09/17/2023, 8:51 AM

## 2023-09-19 LAB — CERVICOVAGINAL ANCILLARY ONLY
Bacterial Vaginitis (gardnerella): POSITIVE — AB
Candida Glabrata: NEGATIVE
Candida Vaginitis: NEGATIVE
Chlamydia: NEGATIVE
Comment: NEGATIVE
Comment: NEGATIVE
Comment: NEGATIVE
Comment: NEGATIVE
Comment: NEGATIVE
Comment: NORMAL
Neisseria Gonorrhea: NEGATIVE
Trichomonas: NEGATIVE

## 2023-09-20 ENCOUNTER — Other Ambulatory Visit (INDEPENDENT_AMBULATORY_CARE_PROVIDER_SITE_OTHER): Payer: Self-pay | Admitting: Primary Care

## 2023-09-20 DIAGNOSIS — B9689 Other specified bacterial agents as the cause of diseases classified elsewhere: Secondary | ICD-10-CM

## 2023-09-20 MED ORDER — METRONIDAZOLE 500 MG PO TABS
500.0000 mg | ORAL_TABLET | Freq: Two times a day (BID) | ORAL | 0 refills | Status: DC
Start: 2023-09-20 — End: 2024-06-11

## 2023-10-06 ENCOUNTER — Encounter (HOSPITAL_COMMUNITY): Payer: Self-pay

## 2023-10-06 ENCOUNTER — Emergency Department (HOSPITAL_COMMUNITY)
Admission: EM | Admit: 2023-10-06 | Discharge: 2023-10-06 | Disposition: A | Discharge status: Home / Self Care | Payer: Self-pay | Attending: Emergency Medicine | Admitting: Emergency Medicine

## 2023-10-06 ENCOUNTER — Emergency Department (HOSPITAL_COMMUNITY): Payer: Self-pay

## 2023-10-06 ENCOUNTER — Other Ambulatory Visit: Payer: Self-pay

## 2023-10-06 DIAGNOSIS — J3489 Other specified disorders of nose and nasal sinuses: Secondary | ICD-10-CM | POA: Insufficient documentation

## 2023-10-06 DIAGNOSIS — Z20822 Contact with and (suspected) exposure to covid-19: Secondary | ICD-10-CM | POA: Insufficient documentation

## 2023-10-06 DIAGNOSIS — Z7951 Long term (current) use of inhaled steroids: Secondary | ICD-10-CM | POA: Insufficient documentation

## 2023-10-06 DIAGNOSIS — R051 Acute cough: Secondary | ICD-10-CM | POA: Insufficient documentation

## 2023-10-06 DIAGNOSIS — R0981 Nasal congestion: Secondary | ICD-10-CM | POA: Insufficient documentation

## 2023-10-06 DIAGNOSIS — J45909 Unspecified asthma, uncomplicated: Secondary | ICD-10-CM | POA: Insufficient documentation

## 2023-10-06 DIAGNOSIS — R0602 Shortness of breath: Secondary | ICD-10-CM | POA: Insufficient documentation

## 2023-10-06 LAB — RESP PANEL BY RT-PCR (RSV, FLU A&B, COVID)  RVPGX2
Influenza A by PCR: NEGATIVE
Influenza B by PCR: NEGATIVE
Resp Syncytial Virus by PCR: NEGATIVE
SARS Coronavirus 2 by RT PCR: NEGATIVE

## 2023-10-06 MED ORDER — AZITHROMYCIN 250 MG PO TABS: 250.0000 mg | ORAL_TABLET | Freq: Every day | ORAL | 0 refills | Status: DC

## 2023-10-06 MED ORDER — IPRATROPIUM-ALBUTEROL 0.5-2.5 (3) MG/3ML IN SOLN
3.0000 mL | Freq: Once | RESPIRATORY_TRACT | Status: AC
  Administered 2023-10-06: 3 mL via RESPIRATORY_TRACT
  Filled 2023-10-06: qty 3

## 2023-10-06 MED ORDER — ALBUTEROL SULFATE HFA 108 (90 BASE) MCG/ACT IN AERS: 1.0000 | INHALATION_SPRAY | Freq: Four times a day (QID) | RESPIRATORY_TRACT | 0 refills | Status: DC | PRN

## 2023-10-06 MED ORDER — PREDNISONE 20 MG PO TABS: 40.0000 mg | ORAL_TABLET | Freq: Every day | ORAL | 0 refills | Status: AC

## 2023-10-06 NOTE — ED Provider Notes (Addendum)
 [place] EMERGENCY DEPARTMENT AT [facility] Provider Note   CSN: [id number] Arrival date & time: 10/06/23  0932     History  Chief Complaint  Patient presents with   Cough    Meghan Galvan is a 48 y.o. female.   Cough Pt presents to the ED with a 2 day Hx of cough, productive congestion. Sick contacts of kids with RSV. Previous Hx of asthma, does not have inhaler. Endorses SOB, taking 3 albuterol  nebulizer treatments, including 1 this morning. Denies tylenol/ibuprofen  use.   Endorses tension like headache  Denies fever, chest pain, ear pain, headache, n/v/d  Home Medications Prior to Admission medications   Medication Sig Start Date End Date Taking? Authorizing Provider  albuterol  (VENTOLIN  HFA) 108 (90 Base) MCG/ACT inhaler Inhale 1-2 puffs into the lungs every 6 (six) hours as needed for wheezing or shortness of breath. 10/06/23  Yes Beola Terrall RAMAN, PA-C  azithromycin  (ZITHROMAX ) 250 MG tablet Take 1 tablet (250 mg total) by mouth daily. Take first 2 tablets together, then 1 every day until finished. 10/06/23  Yes Kyndahl Jablon S, PA-C  predniSONE  (DELTASONE ) 20 MG tablet Take 2 tablets (40 mg total) by mouth daily for 5 days. 10/06/23 10/11/23 Yes Alleyah Twombly S, PA-C  levothyroxine  (SYNTHROID ) 100 MCG tablet TAKE 1 TABLET BY MOUTH EVERY DAY ON AN EMPTY STOMACH 30 MINUTES TO 1 HOUR BEFORE FOOD. 01/24/23   Celestia Rosaline SQUIBB, NP  metroNIDAZOLE  (FLAGYL ) 500 MG tablet Take 1 tablet (500 mg total) by mouth 2 (two) times daily. 09/20/23   Celestia Rosaline SQUIBB, NP      Allergies    Patient has no known allergies.    Review of Systems   Review of Systems  HENT:  Positive for congestion.   Respiratory:  Positive for cough.   All other systems reviewed and are negative.   Physical Exam Updated Vital Signs BP (!) 136/97 (BP Location: Left Arm)   Pulse 80   Temp 98.3 F (36.8 C) (Oral)   Resp 18   Ht 5' 1 (1.549 m)   Wt 85.3 kg   LMP 08/27/2023   SpO2 100%    BMI 35.52 kg/m  Physical Exam Vitals and nursing note reviewed.  Constitutional:      General: She is not in acute distress.    Appearance: Normal appearance.  HENT:     Head: Normocephalic and atraumatic.     Nose: Congestion present. No rhinorrhea.     Mouth/Throat:     Mouth: Mucous membranes are moist.     Pharynx: Oropharynx is clear. No oropharyngeal exudate or posterior oropharyngeal erythema.  Eyes:     General:        Right eye: No discharge.        Left eye: No discharge.     Extraocular Movements: Extraocular movements intact.     Conjunctiva/sclera: Conjunctivae normal.  Cardiovascular:     Rate and Rhythm: Normal rate and regular rhythm.     Pulses: Normal pulses.     Heart sounds: Normal heart sounds. No murmur heard. Pulmonary:     Effort: Pulmonary effort is normal. No respiratory distress.     Breath sounds: Wheezing (Bilateral) and rhonchi (Bilateral) present.  Abdominal:     General: Abdomen is flat.     Palpations: Abdomen is soft.     Tenderness: There is no abdominal tenderness.  Musculoskeletal:        General: No swelling, tenderness or signs of injury.  Right lower leg: No edema.     Left lower leg: No edema.  Skin:    General: Skin is warm and dry.     Coloration: Skin is not jaundiced or pale.     Findings: No erythema.  Neurological:     General: No focal deficit present.     Mental Status: She is alert. Mental status is at baseline.  Psychiatric:        Mood and Affect: Mood normal.     ED Results / Procedures / Treatments   Labs (all labs ordered are listed, but only abnormal results are displayed) Labs Reviewed  RESP PANEL BY RT-PCR (RSV, FLU A&B, COVID)  [id number]    EKG None  Radiology DG Chest 2 View Result Date: 10/06/2023 CLINICAL DATA:  Cough, wheezing EXAM: CHEST - 2 VIEW COMPARISON:  None Available. FINDINGS: Normal mediastinum and cardiac silhouette. Normal pulmonary vasculature. No evidence of effusion, infiltrate,  or pneumothorax. No acute bony abnormality. IMPRESSION: No acute cardiopulmonary process. Electronically Signed   By: Jackquline Boxer M.D.   On: 10/06/2023 10:11    Procedures Procedures    Medications Ordered in ED Medications  ipratropium-albuterol  (DUONEB) 0.5-2.5 (3) MG/3ML nebulizer solution 3 mL (3 mLs Nebulization Given 10/06/23 1021)  ipratropium-albuterol  (DUONEB) 0.5-2.5 (3) MG/3ML nebulizer solution 3 mL (3 mLs Nebulization Given 10/06/23 1100)    ED Course/ Medical Decision Making/ A&P                                 Medical Decision Making Amount and/or Complexity of Data Reviewed Radiology: ordered.  Risk Prescription drug management.   This patient is a 48 year old female who presents to the ED for concern of cough, congestion, shortness of breath.   Differential diagnoses prior to evaluation: The emergent differential diagnosis includes, but is not limited to, URI, pneumonia, COPD exacerbation, asthma exacerbation. This is not an exhaustive differential.   Past Medical History / Co-morbidities / Social History: Asthma  Additional history: Chart reviewed. Pertinent results include:   Prescribed nebulizer at home but does not have inhaler.  Lab Tests/Imaging studies: I personally interpreted labs/imaging and the pertinent results include:    Chest x-ray unremarkable. I agree with the radiologist interpretation.   Medications: I ordered medication including DuoNebs.  I have reviewed the patients home medicines and have made adjustments as needed.  ED Course:  Patient is a 48 year old female returns the ED today complaining of cough, congestion, shortness of breath.  Previous history of asthma.  Has taken 3 albuterol  nebulizer treatments over the course of the last 2 days with relief.  However has continued shortness of breath.  Denies chest pain, fever, abdominal pain, nausea, vomiting.  Vital signs are stable.  On physical exam, wheezing, rhonchi were noted  bilaterally.  Patient was provided to treatments of DuoNebs with improvement.  Mild wheezing and rhonchi were still noted.  Chest x-ray was unremarkable.  Patient is requesting antibiotics as she is not normally have these episodes.  She believes that she has having the beginnings of pneumonia.  Spent significant time explaining difference tween viral and bacterial.  Will provide Z-Pak due to smoking, previously of asthma, and patient request.  Will provide albuterol  inhaler for home.  Provided strict return to ER precautions.  Patient expressed understanding and agreement with plan.   Disposition: After consideration of the diagnostic results and the patients response to treatment, I feel that patient  benefit from discharge and treatment as above.   emergency department workup does not suggest an emergent condition requiring admission or immediate intervention beyond what has been performed at this time. The plan is: Z-Pak, albuterol  inhaler, follow-up with PCP, return for new or worsening symptoms.. The patient is safe for discharge and has been instructed to return immediately for worsening symptoms, change in symptoms or any other concerns.    Final Clinical Impression(s) / ED Diagnoses Final diagnoses:  Acute cough    Rx / DC Orders ED Discharge Orders          Ordered    predniSONE  (DELTASONE ) 20 MG tablet  Daily        10/06/23 1201    albuterol  (VENTOLIN  HFA) 108 (90 Base) MCG/ACT inhaler  Every 6 hours PRN        10/06/23 1201    azithromycin  (ZITHROMAX ) 250 MG tablet  Daily        10/06/23 1201              Kyiesha Millward S, PA-C 10/06/23 1202    Beola Terrall RAMAN, PA-C 10/06/23 1203    Charlyn Sora, MD 10/07/23 [phone]

## 2023-10-06 NOTE — ED Triage Notes (Signed)
 Pt reports having a cough, body aches, and itchy eyes since Friday. Pt reports being around children that have RSV recently. Pt had a hx of asthma but does not use an inhaler or have one.

## 2023-10-06 NOTE — Discharge Instructions (Addendum)
 You were seen today for cough, congestion, shortness of breath.  I do not believe that any emergent concerns at this point in time.  Prescribed albuterol , Z-Pak, prednisone .  Be sure to take antibiotics for its full course.  Chest x-ray was unremarkable. Can take Tylenol for pain Take Tylenol (acetominophen)  650mg  every 4-6 hours, as needed for pain or fever. Do not take more than 4,000 mg in a 24-hour period. As this may cause liver damage. While this is rare, if you begin to develop yellowing of the skin or eyes, stop taking and return to ER immediately.   If you begin having new or worsening symptoms return to the ER for new evaluation

## 2024-03-06 ENCOUNTER — Encounter (HOSPITAL_BASED_OUTPATIENT_CLINIC_OR_DEPARTMENT_OTHER): Payer: Self-pay

## 2024-03-06 ENCOUNTER — Emergency Department (HOSPITAL_BASED_OUTPATIENT_CLINIC_OR_DEPARTMENT_OTHER)
Admission: EM | Admit: 2024-03-06 | Discharge: 2024-03-06 | Disposition: A | Discharge status: Home / Self Care | Payer: PRIVATE HEALTH INSURANCE | Attending: Emergency Medicine | Admitting: Emergency Medicine

## 2024-03-06 ENCOUNTER — Other Ambulatory Visit: Payer: Self-pay

## 2024-03-06 DIAGNOSIS — R109 Unspecified abdominal pain: Secondary | ICD-10-CM | POA: Diagnosis present

## 2024-03-06 DIAGNOSIS — A084 Viral intestinal infection, unspecified: Secondary | ICD-10-CM | POA: Insufficient documentation

## 2024-03-06 DIAGNOSIS — F17210 Nicotine dependence, cigarettes, uncomplicated: Secondary | ICD-10-CM | POA: Insufficient documentation

## 2024-03-06 DIAGNOSIS — E039 Hypothyroidism, unspecified: Secondary | ICD-10-CM | POA: Insufficient documentation

## 2024-03-06 DIAGNOSIS — Z79890 Hormone replacement therapy (postmenopausal): Secondary | ICD-10-CM | POA: Diagnosis not present

## 2024-03-06 LAB — COMPREHENSIVE METABOLIC PANEL WITH GFR
ALT: 11 U/L (ref 0–44)
AST: 21 U/L (ref 15–41)
Albumin: 4.8 g/dL (ref 3.5–5.0)
Alkaline Phosphatase: 65 U/L (ref 38–126)
Anion gap: 18 — ABNORMAL HIGH (ref 5–15)
BUN: 9 mg/dL (ref 6–20)
CO2: 19 mmol/L — ABNORMAL LOW (ref 22–32)
Calcium: 10.8 mg/dL — ABNORMAL HIGH (ref 8.9–10.3)
Chloride: 100 mmol/L (ref 98–111)
Creatinine, Ser: 0.73 mg/dL (ref 0.44–1.00)
GFR, Estimated: >60 mL/min (ref >60)
Glucose, Bld: 123 mg/dL — ABNORMAL HIGH (ref 70–99)
Potassium: 4.1 mmol/L (ref 3.5–5.1)
Sodium: 138 mmol/L (ref 135–145)
Total Bilirubin: 0.9 mg/dL (ref 0.0–1.2)
Total Protein: 8.4 g/dL — ABNORMAL HIGH (ref 6.5–8.1)

## 2024-03-06 LAB — CBC
HCT: 42.3 % (ref 36.0–46.0)
Hemoglobin: 14.1 g/dL (ref 12.0–15.0)
MCH: 29.4 pg (ref 26.0–34.0)
MCHC: 33.3 g/dL (ref 30.0–36.0)
MCV: 88.3 fL (ref 80.0–100.0)
Platelets: 217 10*3/uL (ref 150–400)
RBC: 4.79 MIL/uL (ref 3.87–5.11)
RDW: 15 % (ref 11.5–15.5)
WBC: 9.3 10*3/uL (ref 4.0–10.5)
nRBC: 0 % (ref 0.0–0.2)

## 2024-03-06 LAB — LIPASE, BLOOD: Lipase: 85 U/L — ABNORMAL HIGH (ref 11–51)

## 2024-03-06 MED ORDER — ONDANSETRON 4 MG PO TBDP: 4.0000 mg | ORAL_TABLET | Freq: Three times a day (TID) | ORAL | 0 refills | Status: DC | PRN

## 2024-03-06 MED ORDER — SODIUM CHLORIDE 0.9 % IV BOLUS
1000.0000 mL | Freq: Once | INTRAVENOUS | Status: AC
  Administered 2024-03-06: 1000 mL via INTRAVENOUS

## 2024-03-06 MED ORDER — ONDANSETRON HCL 4 MG/2ML IJ SOLN
4.0000 mg | Freq: Once | INTRAMUSCULAR | Status: AC | PRN
  Filled 2024-03-06: qty 2

## 2024-03-06 MED ORDER — PROMETHAZINE HCL 25 MG PO TABS: 25.0000 mg | ORAL_TABLET | Freq: Four times a day (QID) | ORAL | 0 refills | Status: DC | PRN

## 2024-03-06 MED ORDER — ONDANSETRON HCL 4 MG/2ML IJ SOLN
4.0000 mg | Freq: Once | INTRAMUSCULAR | Status: AC
  Administered 2024-03-06: 4 mg via INTRAVENOUS
  Filled 2024-03-06: qty 2

## 2024-03-06 NOTE — ED Provider Notes (Signed)
 [place] EMERGENCY DEPARTMENT AT [facility] Provider Note  CSN: [id number] Arrival date & time: 03/06/24 1826  Chief Complaint(s) Abdominal Pain and Emesis  HPI Meghan Galvan is a 48 y.o. female with PMHx hypothyroidism presenting with abdominal pain, vomiting and diarrhea.  Onset this morning around 9 AM, reports she has vomited at least 10 times that were initially larger volume but now just stomach acid.  Also had 3 episodes of nonbloody diarrhea.  Associated with crampy nonfocal abdominal pain.  Denies fever.  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  Has been able to tolerate any liquids or solids today.  Denies cough and congestion.  Denies chest pain and shortness of breath.  Has otherwise been in good health prior to symptom onset.  Past Medical History Past Medical History:  Diagnosis Date   Hernia of abdominal wall    Thyroid  disease    Hypothyroidism   There are no active problems to display for this patient.  Home Medication(s) Prior to Admission medications   Medication Sig Start Date End Date Taking? Authorizing Provider  ondansetron (ZOFRAN-ODT) 4 MG disintegrating tablet Take 1 tablet (4 mg total) by mouth every 8 (eight) hours as needed for nausea. 03/06/24  Yes Jonne Netters, MD  albuterol  (VENTOLIN  HFA) 108 [phone] Base) MCG/ACT inhaler Inhale 1-2 puffs into the lungs every 6 (six) hours as needed for wheezing or shortness of breath. 10/06/23   Bauer, Collin S, PA-C  azithromycin  (ZITHROMAX ) 250 MG tablet Take 1 tablet (250 mg total) by mouth daily. Take first 2 tablets together, then 1 every day until finished. 10/06/23   Bauer, Collin S, PA-C  levothyroxine  (SYNTHROID ) 100 MCG tablet TAKE 1 TABLET BY MOUTH EVERY DAY ON AN EMPTY STOMACH 30 MINUTES TO 1 HOUR BEFORE FOOD. 01/24/23   Marius Siemens, NP  metroNIDAZOLE  (FLAGYL ) 500 MG tablet Take 1 tablet (500 mg total) by mouth 2 (two) times daily. 09/20/23   Marius Siemens, NP                                                                                                                                    Past Surgical History Past Surgical History:  Procedure Laterality Date   THYROIDECTOMY  March 2016   Family History History reviewed. No pertinent family history.  Social History Social History   Tobacco Use   Smoking status: Some Days    Current packs/day: 0.25    Types: Cigarettes   Smokeless tobacco: Never  Vaping Use   Vaping status: Never Used  Substance Use Topics   Alcohol use: Not Currently    Alcohol/week: 4.0 standard drinks of alcohol    Types: 4 Glasses of wine per week    Comment: occasional   Drug use: No   Allergies Patient has no known allergies.  Review of Systems A thorough review of systems was obtained and  all systems are negative except as noted in the HPI and PMH.   Physical Exam Vital Signs  I have reviewed the triage vital signs BP 130/79 (BP Location: Right Arm)   Pulse 68   Temp 98 F (36.7 C) (Temporal)   Resp 18   Ht 5' 1 (1.549 m)   Wt 85.3 kg   SpO2 100%   BMI 35.53 kg/m  Physical Exam General: Fatigued appearing, NAD. Eyes: PERRLA, anicteric sclera ENTM: Dry oral mucosa.  Mildly erythematous posterior oropharynx.  No palpable cervical lymphadenopathy. Neck: Supple, non-tender Cardiovascular: RRR without murmur Respiratory: CTAB. Normal WOB on RA Gastrointestinal: Soft, non-distended.  + BS.  TTP over abdominal exam diffusely, no focality.  No rebound tenderness or guarding. MSK: No peripheral edema Derm: Warm, dry, no rashes noted Neuro: Motor and sensation intact globally  ED Results and Treatments Labs (all labs ordered are listed, but only abnormal results are displayed) Labs Reviewed  LIPASE, BLOOD - Abnormal; Notable for the following components:      Result Value   Lipase 85 (*)    All other components within normal limits  COMPREHENSIVE METABOLIC PANEL WITH GFR -  Abnormal; Notable for the following components:   CO2 19 (*)    Glucose, Bld 123 (*)    Calcium  10.8 (*)    Total Protein 8.4 (*)    Anion gap 18 (*)    All other components within normal limits  CBC  PREGNANCY, URINE  URINALYSIS, ROUTINE W REFLEX MICROSCOPIC                                                                                                                          Radiology No results found.  Pertinent labs & imaging results that were available during my care of the patient were reviewed by me and considered in my medical decision making (see MDM for details).  Medications Ordered in ED Medications  ondansetron (ZOFRAN) injection 4 mg (4 mg Intravenous Given 03/06/24 1840)  sodium chloride  0.9 % bolus 1,000 mL (1,000 mLs Intravenous New Bag/Given 03/06/24 1948)  ondansetron (ZOFRAN) injection 4 mg (4 mg Intravenous Given 03/06/24 2014)                                                                   Medical Decision Making / ED Course    Medical Decision Making:    Meghan Galvan is a 48 y.o. female PMHx hypothyroidism presenting with abdominal pain, vomiting and diarrhea.. The complaint involves an extensive differential diagnosis and also carries with it a high risk of complications and morbidity.  Serious etiology was considered. Ddx includes but is not limited to: Gastroenteritis, gastric ulcer, cholecystitis  Complete initial physical exam performed, notably the patient  was in no distress.    Reviewed and confirmed nursing documentation for past medical history, family history, social history.  Vital signs reviewed.     Brief summary:  48 year old female with history as above presenting with vomiting, diarrhea and generalized abdominal pain but otherwise well-appearing.  Exam and history most consistent with viral gastroenteritis given exposure at work.  Obtain basic blood work and provide symptomatic management and fluid resuscitation.   Clinical Course  as of 03/06/24 2052  Fri Mar 06, 2024  2005 Continued nausea will redose Zofran and reassess. [KH]  2052 Some improvement with additional Zofran dose and IV fluids.  Will discharge with Zofran and recommend rest and oral hydration. [KH]    Clinical Course User Index [KH] Jonne Netters, MD      Additional history obtained: -Additional history obtained from family -External records from outside source obtained and reviewed including: Chart review including previous notes, labs, imaging, consultation notes including   Lab Tests: -I ordered, reviewed, and interpreted labs.   The pertinent results include:   Labs Reviewed  LIPASE, BLOOD - Abnormal; Notable for the following components:      Result Value   Lipase 85 (*)    All other components within normal limits  COMPREHENSIVE METABOLIC PANEL WITH GFR - Abnormal; Notable for the following components:   CO2 19 (*)    Glucose, Bld 123 (*)    Calcium  10.8 (*)    Total Protein 8.4 (*)    Anion gap 18 (*)    All other components within normal limits  CBC  PREGNANCY, URINE  URINALYSIS, ROUTINE W REFLEX MICROSCOPIC     Medicines ordered and prescription drug management: Meds ordered this encounter  Medications   ondansetron (ZOFRAN) injection 4 mg   sodium chloride  0.9 % bolus 1,000 mL   ondansetron (ZOFRAN) injection 4 mg   ondansetron (ZOFRAN-ODT) 4 MG disintegrating tablet    Sig: Take 1 tablet (4 mg total) by mouth every 8 (eight) hours as needed for nausea.    Dispense:  20 tablet    Refill:  0    -I have reviewed the patients home medicines and have made adjustments as needed  Reevaluation: After the interventions noted above, I reevaluated the patient and found that they have improved  Co morbidities that complicate the patient evaluation  Past Medical History:  Diagnosis Date   Hernia of abdominal wall    Thyroid  disease    Hypothyroidism      Dispostion: Disposition decision including need for  hospitalization was considered, and patient discharged from emergency department.    Final Clinical Impression(s) / ED Diagnoses Final diagnoses:  Viral gastroenteritis        Jonne Netters, MD 03/06/24 2053    Lowery Rue, DO 03/06/24 2119

## 2024-03-06 NOTE — ED Triage Notes (Signed)
 Pt reports lower mid abd pain since this AM w/ N/V. Pt reports working at [employer] where stomach flu is currently going around.

## 2024-03-06 NOTE — ED Provider Notes (Signed)
 Supervised resident visit.  Patient with nausea vomiting diarrhea that started today.  Sick contacts at work.  Unremarkable vitals.  No fever.  Does not really have any focal abdominal pain on exam.  She has had several episodes of emesis and diarrhea today.  Differential diagnosis likely viral gastroenteritis will evaluate for dehydration electrolyte abnormality.  No recent antibiotics.  Have no concern for C. difficile or other acute process.  Have no concern for bowel obstruction colitis and at this time I do not feel any indication for CT imaging.  Ultimately will get basic labs hydrate her give her Zofran anticipate discharge to home with antiemetics.  Can consider using Imodium if diarrhea is persisting more than a couple days.  Please see my resident note for further results evaluation and disposition of the patient.  This chart was dictated using voice recognition software.  Despite best efforts to proofread,  errors can occur which can change the documentation meaning.    Meghan Rue, DO 03/06/24 1906

## 2024-03-06 NOTE — ED Notes (Signed)
 Provider notified patient is still symptomatic

## 2024-03-06 NOTE — Discharge Instructions (Addendum)
 Please stay well-hydrated at home and focus on drinking plenty of fluids.  You can utilize the Zofran and Phenergan  as needed for nausea as well.  I would recommend trying 1 or the other and keep in mind the Phenergan  will likely make you sleepy. I recommend using Tylenol for abdominal upset and you can consider using Imodium for your diarrhea.  Please follow-up with your PCP next week if your symptoms continue.

## 2024-05-18 ENCOUNTER — Other Ambulatory Visit (INDEPENDENT_AMBULATORY_CARE_PROVIDER_SITE_OTHER): Payer: Self-pay | Admitting: Primary Care

## 2024-05-18 DIAGNOSIS — E039 Hypothyroidism, unspecified: Secondary | ICD-10-CM

## 2024-05-18 NOTE — Telephone Encounter (Unsigned)
 Copied from CRM #[id number]. Topic: Clinical - Medication Refill >> May 18, 2024  3:05 PM Precious C wrote: Medication: levothyroxine  (SYNTHROID ) 100 MCG tablet  Has the patient contacted their pharmacy? Yes (Agent: If no, request that the patient contact the pharmacy for the refill. If patient does not wish to contact the pharmacy document the reason why and proceed with request.) (Agent: If yes, when and what did the pharmacy advise?)  This is the patient's preferred pharmacy:  [id number] - [place], [place] - [id number] [place] [address] Phone: [phone] Fax: [phone]   Is this the correct pharmacy for this prescription? Yes If no, delete pharmacy and type the correct one.   Has the prescription been filled recently? No  Is the patient out of the medication? Yes  Has the patient been seen for an appointment in the last year OR does the patient have an upcoming appointment? Yes  Can we respond through MyChart? No  Agent: Please be advised that Rx refills may take up to 3 business days. We ask that you follow-up with your pharmacy.

## 2024-05-19 ENCOUNTER — Encounter (INDEPENDENT_AMBULATORY_CARE_PROVIDER_SITE_OTHER): Payer: Self-pay | Admitting: Primary Care

## 2024-05-19 NOTE — Telephone Encounter (Signed)
 Requested medications are due for refill today.  yes  Requested medications are on the active medications list.  yes  Last refill. 01/24/2023 #90 1 rf  Future visit scheduled.   no  Notes to clinic.  Labs are expired. Rx is over 49 year old.    Requested Prescriptions  Pending Prescriptions Disp Refills   levothyroxine  (SYNTHROID ) 100 MCG tablet 90 tablet 1    Sig: TAKE 1 TABLET BY MOUTH EVERY DAY ON AN EMPTY STOMACH 30 MINUTES TO 1 HOUR BEFORE FOOD.     Endocrinology:  Hypothyroid Agents Failed - 05/19/2024  5:13 PM      Failed - TSH in normal range and within 360 days    TSH  Date Value Ref Range Status  11/01/2022 3.560 0.450 - 4.500 uIU/mL Final         Passed - Valid encounter within last 12 months    Recent Outpatient Visits           8 months ago Vaginal discharge   [place] [facility] Celestia Rosaline SQUIBB, NP   1 year ago Vaginal discharge   [place] [facility] Celestia Rosaline SQUIBB, NP   1 year ago Vaginal odor   [place] [facility] Celestia Rosaline SQUIBB, NP   1 year ago Dysuria   [place] [facility] Celestia Rosaline SQUIBB, NP   1 year ago Respiratory symptoms   [place] [facility] Celestia Rosaline SQUIBB, NP

## 2024-05-20 ENCOUNTER — Telehealth (INDEPENDENT_AMBULATORY_CARE_PROVIDER_SITE_OTHER): Payer: Self-pay | Admitting: Primary Care

## 2024-05-20 NOTE — Telephone Encounter (Signed)
 Copied from CRM [id number]. Topic: Clinical - Request for Lab/Test Order >> May 20, 2024 10:22 AM Harlene ORN wrote: Reason for CRM: patient called wanting to come in today to do lab work. No active orders in her chart. Requesting to have orders active today to do blood work.

## 2024-06-04 ENCOUNTER — Encounter (INDEPENDENT_AMBULATORY_CARE_PROVIDER_SITE_OTHER): Payer: Self-pay | Admitting: Primary Care

## 2024-06-08 ENCOUNTER — Telehealth: Payer: Self-pay

## 2024-06-08 NOTE — Telephone Encounter (Signed)
 Att to contact pt to schedule appt w/[facility] since could not get in to provider sooner.no ans phone just rings unable to lvm

## 2024-06-09 ENCOUNTER — Telehealth: Payer: Self-pay

## 2024-06-09 NOTE — Telephone Encounter (Signed)
 Att to contact pt to get scheduled for appt on 06/11/24 no ans lvm to call to schedule or patient may be scheduled anytime on 06/11/24 between 9-2pm

## 2024-06-11 ENCOUNTER — Telehealth (INDEPENDENT_AMBULATORY_CARE_PROVIDER_SITE_OTHER): Admitting: Nurse Practitioner

## 2024-06-11 ENCOUNTER — Encounter: Payer: Self-pay | Admitting: Nurse Practitioner

## 2024-06-11 VITALS — BP 122/80 | HR 71 | Resp 16 | Ht 61.0 in | Wt 182.0 lb

## 2024-06-11 DIAGNOSIS — E039 Hypothyroidism, unspecified: Secondary | ICD-10-CM | POA: Diagnosis not present

## 2024-06-11 MED ORDER — LEVOTHYROXINE SODIUM 100 MCG PO TABS: ORAL_TABLET | ORAL | 1 refills | Status: AC

## 2024-06-11 NOTE — Progress Notes (Signed)
 Pt presents for hypothyroidism

## 2024-06-11 NOTE — Progress Notes (Signed)
 Virtual Primary Care Telehealth Visit  Virtual Visit Consent  Meghan Galvan, you are scheduled for a virtual visit with a [facility] provider today. Just as with appointments in the office, your consent must be obtained to participate. Your consent will be active for this visit and any virtual visit you may have with one of our providers in the next 365 days. If you have a MyChart account, a copy of this consent can be sent to you electronically.  By engaging in this virtual visit, you consent to the provision of healthcare and authorize for your insurance to be billed (if applicable) for the services provided during this visit. Depending on your insurance coverage, you may receive a charge related to this service.  I need to obtain your verbal consent now. Are you willing to proceed with your visit today? Meghan Galvan has provided verbal consent on 06/11/2024 for a virtual visit (via [employer]). Lauraine Kitty, FNP  Date: 06/11/2024 2:19 PM  Virtual Visit via Video Note   I, Lauraine Kitty, connected with  Meghan Galvan  ([id number], 1976-03-29) on 06/11/24 at  2:00 PM EDT by a video-enabled telemedicine application and verified that I am speaking with the correct person using two identifiers.  Telepresenter, Berlinda Pouch, present for entirety of visit to assist with video functionality and physical examination via [employer] device.   This is an initial visit to discuss the opportunity to become a primary care patient at [facility]  The patient understands that if their medical background is complex, their case will be reviewed with the Medical Director, and if [facility] is not the ideal location for their care, our team will help establish the patient with a primary care provider in the area.   If this is determined that this location is not the best option for the patient, in the future if the patient's medical condition changes we can re explore the option of this location  serving as their primary care location.  The patient understands that by becoming a primary care patient, this would be the location for their primary care, and if they chose to leave this location and seek primary care services at another location they will not be able to continue their relationship with this clinic.    Location: Patient: Virtual Visit Location Patient: [facility] visit location: [facility]  Provider: Virtual Visit Location Provider: Home Office   History of Present Illness:  Meghan Galvan is a 48 y.o. who identifies as a female who was assigned female at birth, and is being seen today as a new patient with the [facility]. She has been a patient at [facility] in the past but has not been seen in office in over 2 years. Was recently denied medication refill due to need for office visit and lab check.   Presents today with concern and need for thyroid  medication refill - she has been out of her medicine for the past 2 months. She had been on a stable dosage for the past 2 years.    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  Surgery was performed in [place]  TSH elevation first noted in 2018 in EMR at 63 and has been well managed on Synthroid  100mcg  She has been actively taking part in an exercise program, uses collagen daily    Do you have a current Primary Care Provider? Yes Have  you seen a Primary Care Provider in the past? Yes Do you live in [facility]? No Do you live in the [facility]? No  Do you have a history of Diabetes No Do you have a history of HTN No Do you have a history of high cholesterol? No Have you been diagnosed with kidney disease or kidney failure in the past? No Have you been diagnosed with an autoimmune disease? No Have you been diagnosed with sleep apnea? No Have you been diagnosed with Asthma or COPD? Yes Adult  onset asthma but has been resolved since moving to   She has needed an inhaler for acute illness only in recent years   Do you have a history of tobacco use? Yes Have you ever suffered a stroke or TIA? No Have you ever had a seizure? No Have you been diagnosed with ADHD? No Have you been diagnosed with anxiety or depression? 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   Allergies: No Known Allergies Medications:  Current Outpatient Medications:    albuterol  (VENTOLIN  HFA) 108 (90 Base) MCG/ACT inhaler, Inhale 1-2 puffs into the lungs every 6 (six) hours as needed for wheezing or shortness of breath., Disp: 8 g, Rfl: 0   azithromycin  (ZITHROMAX ) 250 MG tablet, Take 1 tablet (250 mg total) by mouth daily. Take first 2 tablets together, then 1 every day until finished., Disp: 6 tablet, Rfl: 0   levothyroxine  (SYNTHROID ) 100 MCG tablet, TAKE 1 TABLET BY MOUTH EVERY DAY ON AN EMPTY STOMACH 30 MINUTES TO 1 HOUR BEFORE FOOD., Disp: 90 tablet, Rfl: 1   metroNIDAZOLE  (FLAGYL ) 500 MG tablet, Take 1 tablet (500 mg total) by mouth 2 (two) times daily., Disp: 14 tablet, Rfl: 0   ondansetron  (ZOFRAN -ODT) 4 MG disintegrating tablet, Take 1 tablet (4 mg total) by mouth every 8 (eight) hours as needed for nausea., Disp: 20 tablet, Rfl: 0   promethazine  (PHENERGAN ) 25 MG tablet, Take 1 tablet (25 mg total) by mouth every 6 (six) hours as needed for nausea or vomiting., Disp: 30 tablet, Rfl: 0  Observations/Objective: Physical Exam Constitutional:      General: She  is not in acute distress.    Appearance: Normal appearance.  HENT:     Nose: Nose normal.     Mouth/Throat:     Mouth: Mucous membranes are moist.  Pulmonary:     Effort: Pulmonary effort is normal.  Neurological:     Mental Status: She is alert and oriented to person, place, and time.  Psychiatric:        Mood and Affect: Mood normal.     Vitals:   06/11/24 1427  BP: 122/80  Pulse: 71  Resp: 16  Height: 5' 1 (1.549 m)  Weight: 182 lb (82.6 kg)  SpO2: 97%  BMI (Calculated): 34.41     Assessment and Plan:  1. Hypothyroidism, unspecified type (Primary)  - TSH + free T4 - CBC with Differential/Platelet - Comprehensive metabolic panel with GFR - Hemoglobin A1c -  Lipid panel - VITAMIN D  25 Hydroxy (Vit-D Deficiency, Fractures) - levothyroxine  (SYNTHROID ) 100 MCG tablet; TAKE 1 TABLET BY MOUTH EVERY DAY ON AN EMPTY STOMACH 30 MINUTES TO 1 HOUR BEFORE FOOD.  Dispense: 90 tablet; Refill: 1   Follow Up Instructions: I discussed the assessment and treatment plan with the patient. The Telepresenter provided patient with a physical copy of my written instructions for review.   The patient was advised to call back or seek an in-person evaluation if the symptoms worsen or if the condition fails to improve as anticipated.    Lauraine Kitty, FNP  **Disclaimer: This note may have been dictated with voice recognition software. Similar sounding words can inadvertently be transcribed and this note may contain transcription errors which may not have been corrected upon publication of note.**

## 2024-06-11 NOTE — Progress Notes (Signed)
 The patient presented for a primary care visit on 06/11/2024 to establish care. Blood pressure screening was conducted, and the result was 122/80. During the appointment, pt declined SDOH screener. The patient's chart review indicates food SDOH and smoking. Pt declined SDOH resources for today and will consider them in the future.    A review of the patient's chart revealed that they currently have a primary care provider Everlina Bohr, NP) and no future appointments were indicated.  An additional follow up will be done according to the health equity team's protocol.

## 2024-06-15 ENCOUNTER — Ambulatory Visit: Payer: Self-pay | Admitting: Nurse Practitioner

## 2024-06-15 ENCOUNTER — Encounter: Payer: Self-pay | Admitting: Nurse Practitioner

## 2024-06-15 LAB — CBC WITH DIFFERENTIAL/PLATELET
Basophils Absolute: 0.1 x10E3/uL (ref 0.0–0.2)
Basos: 1 %
EOS (ABSOLUTE): 0.1 x10E3/uL (ref 0.0–0.4)
Eos: 2 %
Hematocrit: 41.5 % (ref 34.0–46.6)
Hemoglobin: 13.3 g/dL (ref 11.1–15.9)
Immature Grans (Abs): 0 x10E3/uL (ref 0.0–0.1)
Immature Granulocytes: 0 %
Lymphocytes Absolute: 2.7 x10E3/uL (ref 0.7–3.1)
Lymphs: 44 %
MCH: 29.8 pg (ref 26.6–33.0)
MCHC: 32 g/dL (ref 31.5–35.7)
MCV: 93 fL (ref 79–97)
Monocytes Absolute: 0.4 x10E3/uL (ref 0.1–0.9)
Monocytes: 7 %
Neutrophils Absolute: 2.8 x10E3/uL (ref 1.4–7.0)
Neutrophils: 45 %
Platelets: 262 x10E3/uL (ref 150–450)
RBC: 4.47 x10E6/uL (ref 3.77–5.28)
RDW: 14.5 % (ref 11.7–15.4)
WBC: 6.2 x10E3/uL (ref 3.4–10.8)

## 2024-06-15 LAB — VITAMIN D 25 HYDROXY (VIT D DEFICIENCY, FRACTURES): Vit D, 25-Hydroxy: 19.1 ng/mL — ABNORMAL LOW (ref 30.0–100.0)

## 2024-06-15 LAB — HEMOGLOBIN A1C
Est. average glucose Bld gHb Est-mCnc: 111 mg/dL
Hgb A1c MFr Bld: 5.5 % (ref 4.8–5.6)

## 2024-06-15 LAB — TSH+FREE T4: TSH: 65 u[IU]/mL — ABNORMAL HIGH (ref 0.450–4.500)

## 2024-06-15 LAB — COMPREHENSIVE METABOLIC PANEL WITH GFR
ALT: 41 IU/L — ABNORMAL HIGH (ref 0–32)
AST: 40 IU/L (ref 0–40)
Albumin: 4.8 g/dL (ref 3.9–4.9)
Alkaline Phosphatase: 53 IU/L (ref 41–116)
BUN/Creatinine Ratio: 12 (ref 9–23)
BUN: 11 mg/dL (ref 6–24)
Bilirubin Total: 1 mg/dL (ref 0.0–1.2)
CO2: 21 mmol/L (ref 20–29)
Calcium: 9.5 mg/dL (ref 8.7–10.2)
Chloride: 102 mmol/L (ref 96–106)
Creatinine, Ser: 0.95 mg/dL (ref 0.57–1.00)
Globulin, Total: 2.7 g/dL (ref 1.5–4.5)
Glucose: 85 mg/dL (ref 70–99)
Potassium: 3.9 mmol/L (ref 3.5–5.2)
Sodium: 139 mmol/L (ref 134–144)
Total Protein: 7.5 g/dL (ref 6.0–8.5)
eGFR: 74 mL/min/1.73 (ref >59)

## 2024-06-15 LAB — LIPID PANEL
Chol/HDL Ratio: 4.1 ratio (ref 0.0–4.4)
Cholesterol, Total: 348 mg/dL — ABNORMAL HIGH (ref 100–199)
HDL: 84 mg/dL (ref >39)
LDL Chol Calc (NIH): 233 mg/dL — ABNORMAL HIGH (ref 0–99)
Triglycerides: 165 mg/dL — ABNORMAL HIGH (ref 0–149)
VLDL Cholesterol Cal: 31 mg/dL (ref 5–40)

## 2024-08-31 ENCOUNTER — Telehealth: Admitting: Nurse Practitioner

## 2024-08-31 ENCOUNTER — Encounter: Payer: Self-pay | Admitting: Nurse Practitioner

## 2024-09-03 ENCOUNTER — Ambulatory Visit: Payer: Self-pay | Admitting: Nurse Practitioner

## 2024-09-29 NOTE — Progress Notes (Signed)
 Pt was seen at [facility] on 06/11/24 to establish care, pt had a BP of 122/80. At the time of the visit pt declined SDOH screening and declined SDOH resource despite having a recorded food insecurity.  Chart review indicates pt is now established with [facility] provider Parker,Sarah FNP and has Environmental Education Officer.   This CHW called pt to follow up on SDOH food insecurity. Attempt #1 on 09/30/23 at  1054. Pt stated that she recently gained custody of her 4 grandchildren. Pt attempted to get SNAP however her application was denied due to her income being too high. Pt stated she can feed everyone but its tight some weeks. Pt agreed for CHW to send her out food resources.  Letter sent to pt with multiple SDOH food resources and smoking cessation educational material.  Future follow up to be scheduled per [facility] protocol.
